# Patient Record
Sex: Female | Born: 1995 | Race: Black or African American | Hispanic: No | Marital: Single | Smoking: Never smoker
Health system: Southern US, Community
[De-identification: ages and names within clinical notes are randomized; demographics above are authoritative.]

## PROBLEM LIST (undated history)

## (undated) DIAGNOSIS — F410 Panic disorder [episodic paroxysmal anxiety] without agoraphobia: Secondary | ICD-10-CM

## (undated) DIAGNOSIS — L0291 Cutaneous abscess, unspecified: Secondary | ICD-10-CM

## (undated) DIAGNOSIS — F329 Major depressive disorder, single episode, unspecified: Secondary | ICD-10-CM

## (undated) DIAGNOSIS — F32A Depression, unspecified: Secondary | ICD-10-CM

## (undated) DIAGNOSIS — N809 Endometriosis, unspecified: Secondary | ICD-10-CM

## (undated) DIAGNOSIS — J45909 Unspecified asthma, uncomplicated: Secondary | ICD-10-CM

## (undated) DIAGNOSIS — K219 Gastro-esophageal reflux disease without esophagitis: Secondary | ICD-10-CM

## (undated) HISTORY — DX: Gastro-esophageal reflux disease without esophagitis: K21.9

## (undated) HISTORY — PX: MYRINGOTOMY WITH TUBE PLACEMENT: SHX5663

## (undated) HISTORY — PX: OTHER SURGICAL HISTORY: SHX169

## (undated) HISTORY — DX: Endometriosis, unspecified: N80.9

---
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
## 2016-04-25 ENCOUNTER — Encounter: Payer: Self-pay | Admitting: Pediatrics

## 2016-04-26 ENCOUNTER — Encounter: Payer: Self-pay | Admitting: Pediatrics

## 2016-06-03 ENCOUNTER — Encounter (HOSPITAL_COMMUNITY): Payer: Self-pay | Admitting: *Deleted

## 2016-06-03 DIAGNOSIS — R000 Tachycardia, unspecified: Secondary | ICD-10-CM | POA: Diagnosis not present

## 2016-06-03 DIAGNOSIS — R42 Dizziness and giddiness: Secondary | ICD-10-CM | POA: Diagnosis not present

## 2016-06-03 LAB — CBC
HCT: 42.1 % (ref 36.0–46.0)
Hemoglobin: 14.3 g/dL (ref 12.0–15.0)
MCH: 28.7 pg (ref 26.0–34.0)
MCHC: 34 g/dL (ref 30.0–36.0)
MCV: 84.5 fL (ref 78.0–100.0)
PLATELETS: 251 10*3/uL (ref 150–400)
RBC: 4.98 MIL/uL (ref 3.87–5.11)
RDW: 12.8 % (ref 11.5–15.5)
WBC: 10.4 10*3/uL (ref 4.0–10.5)

## 2016-06-03 NOTE — ED Triage Notes (Signed)
The pt is c/o a headache to the back of her head  And dizziness for one week

## 2016-06-04 ENCOUNTER — Emergency Department (HOSPITAL_COMMUNITY)
Admission: EM | Admit: 2016-06-04 | Discharge: 2016-06-04 | Disposition: A | Discharge status: Home / Self Care | Payer: Medicaid Other | Attending: Emergency Medicine | Admitting: Emergency Medicine

## 2016-06-04 DIAGNOSIS — R42 Dizziness and giddiness: Secondary | ICD-10-CM

## 2016-06-04 DIAGNOSIS — R000 Tachycardia, unspecified: Secondary | ICD-10-CM

## 2016-06-04 LAB — I-STAT BETA HCG BLOOD, ED (MC, WL, AP ONLY): I-stat hCG, quantitative: <5 m[IU]/mL (ref <5)

## 2016-06-04 LAB — TSH: TSH: 1.079 u[IU]/mL (ref 0.350–4.500)

## 2016-06-04 MED ORDER — ONDANSETRON 4 MG PO TBDP: 4.0000 mg | ORAL_TABLET | Freq: Three times a day (TID) | ORAL | 0 refills | Status: DC | PRN

## 2016-06-04 MED ORDER — SODIUM CHLORIDE 0.9 % IV BOLUS (SEPSIS)
1000.0000 mL | Freq: Once | INTRAVENOUS | Status: AC
  Administered 2016-06-04: 1000 mL via INTRAVENOUS

## 2016-06-04 NOTE — ED Provider Notes (Signed)
[facility] DEPT Provider Note   CSN: [id number] Arrival date & time: 06/03/16  2233     History   Chief Complaint Chief Complaint  Patient presents with  . Dizziness    HPI Angela Ramos is a 20 y.o. female.  Symptoms of lightheadedness/dizziness for the past one week, that became constant instead of intermittent today prompting emergency department evaluation. No fever, nausea, vomiting, diarrhea, anorexia, SOB or cough. She reports a feeling of heart racing when symptomatic. She experiences worse symptoms when standing or walking but also has mild persistent symptoms while lying still. She states she has had similar symptoms in the past but less intense and shorter duration.   The history is provided by the patient. No language interpreter was used.  Dizziness  Quality:  Head spinning and lightheadedness Severity:  Moderate Duration:  1 week Associated symptoms: palpitations   Associated symptoms: no nausea     Past Medical History:  Diagnosis Date  . Panic attacks     Patient Active Problem List   Diagnosis Date Noted  . Routine screening for STI (sexually transmitted infection) 06/01/2014  . Dysmenorrhea 06/01/2014  . Encounter for management and injection of injectable progestin contraceptive 06/01/2014    History reviewed. No pertinent surgical history.  OB History    No data available       Home Medications    Prior to Admission medications   Medication Sig Start Date End Date Taking? Authorizing Provider  albuterol (PROVENTIL HFA;VENTOLIN HFA) 108 (90 BASE) MCG/ACT inhaler Inhale 2 puffs into the lungs every 6 (six) hours as needed for wheezing or shortness of breath. 02/28/15 03/31/15  Glynis Smiles, DO    Family History No family history on file.  Social History Social History  Substance Use Topics  . Smoking status: Never Smoker  . Smokeless tobacco: Never Used  . Alcohol use No     Allergies   Review of patient's allergies  indicates no known allergies.   Review of Systems Review of Systems  Constitutional: Negative for chills and fever.  HENT: Negative.   Eyes: Negative for visual disturbance.  Respiratory: Negative.   Cardiovascular: Positive for palpitations.  Gastrointestinal: Negative.  Negative for nausea.  Genitourinary: Negative for dysuria.  Musculoskeletal: Negative.   Skin: Negative.   Neurological: Positive for dizziness. Negative for syncope.     Physical Exam Updated Vital Signs BP 145/88 (BP Location: Right Arm)   Pulse (!) 145   Temp 98.9 F (37.2 C) (Oral)   Resp 18   Ht 5' 1.5" (1.562 m)   Wt 47.9 kg   SpO2 100%   BMI 19.61 kg/m   Physical Exam  Constitutional: She is oriented to person, place, and time. She appears well-developed and well-nourished.  HENT:  Head: Normocephalic.  Eyes: Pupils are equal, round, and reactive to light.  Neck: Normal range of motion. Neck supple. No thyromegaly present.  Cardiovascular: Regular rhythm.  Tachycardia present.   No murmur heard. Pulmonary/Chest: Effort normal and breath sounds normal.  Abdominal: Soft. Bowel sounds are normal. There is no tenderness. There is no rebound and no guarding.  Musculoskeletal: Normal range of motion.  Neurological: She is alert and oriented to person, place, and time. She has normal strength and normal reflexes. No sensory deficit. She displays a negative Romberg sign. Coordination normal.  Skin: Skin is warm and dry. No rash noted.  Psychiatric: She has a normal mood and affect.     ED Treatments / Results  Labs (  all labs ordered are listed, but only abnormal results are displayed) Labs Reviewed  CBC  TSH  I-STAT BETA HCG BLOOD, ED ([facility], [facility], [facility] ONLY)    EKG  EKG Interpretation None       Radiology No results found.  Procedures Procedures (including critical care time)  Medications Ordered in ED Medications  sodium chloride 0.9 % bolus 1,000 mL (not administered)      Initial Impression / Assessment and Plan / ED Course  I have reviewed the triage vital signs and the nursing notes.  Pertinent labs & imaging results that were available during my care of the patient were reviewed by me and considered in my medical decision making (see chart for details).  Clinical Course    Patient presents with complaint of dizziness without syncope for the past 1 week, worse today. No fever, pain, vomiting, diarrhea.   She is tachycardic on arrival. Normal HGB, thyroid, negative pregnancy. IVF's provided with improvement. EKG tachycardic, NSR. She is ambulatory with less dizziness, "80% better". She can be discharged to home with PCP follow up.  Final Clinical Impressions(s) / ED Diagnoses   Final diagnoses:  None   1. Dizziness 2. Tachycardia  New Prescriptions New Prescriptions   No medications on file     Charlann Lange, PA-C 06/04/16 [place], DO 06/04/16 [id number]

## 2016-07-02 IMAGING — CR DG THORACOLUMBAR SPINE STANDING SCOLIOSIS
2 series · 6 of 6 positions shown · non-contrast
Comparison: None.

CLINICAL DATA: Scoliosis.

EXAM:
THORACOLUMBAR SCOLIOSIS STUDY - STANDING VIEWS

[Series 1001: view not recorded · 0.40mm/px · 3 of 3 slices shown (1 of 2)]
[im 1/3]
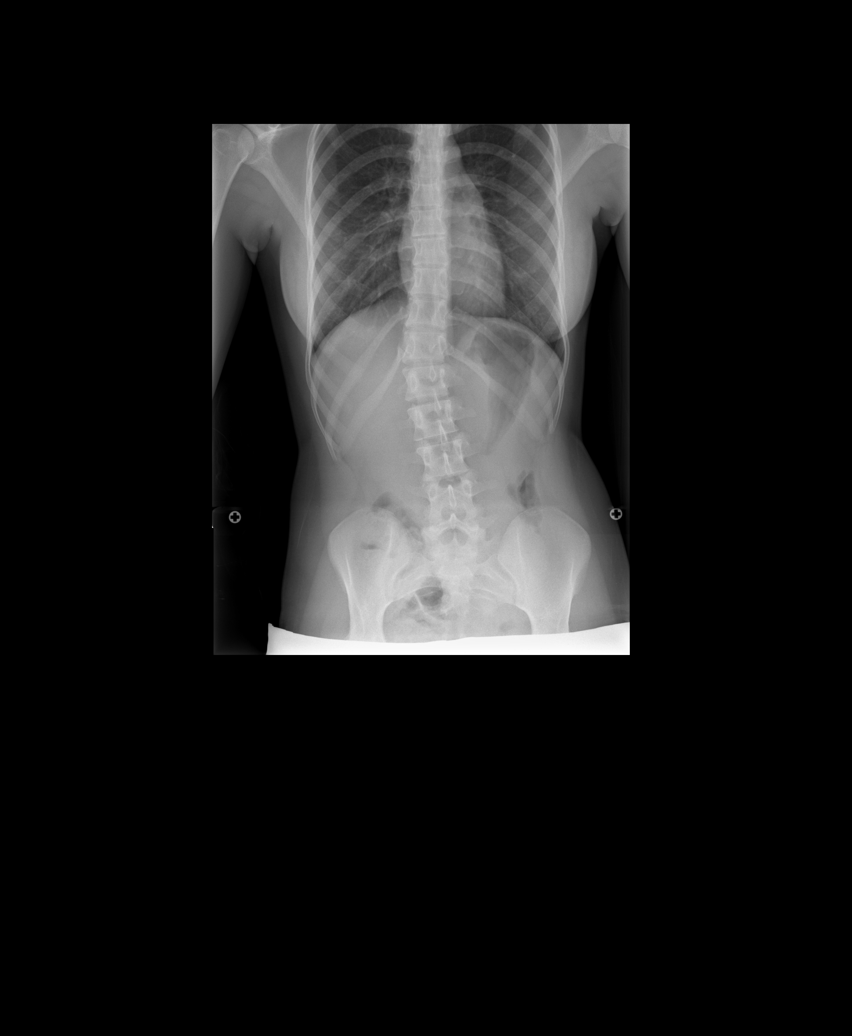
[im 2/3]
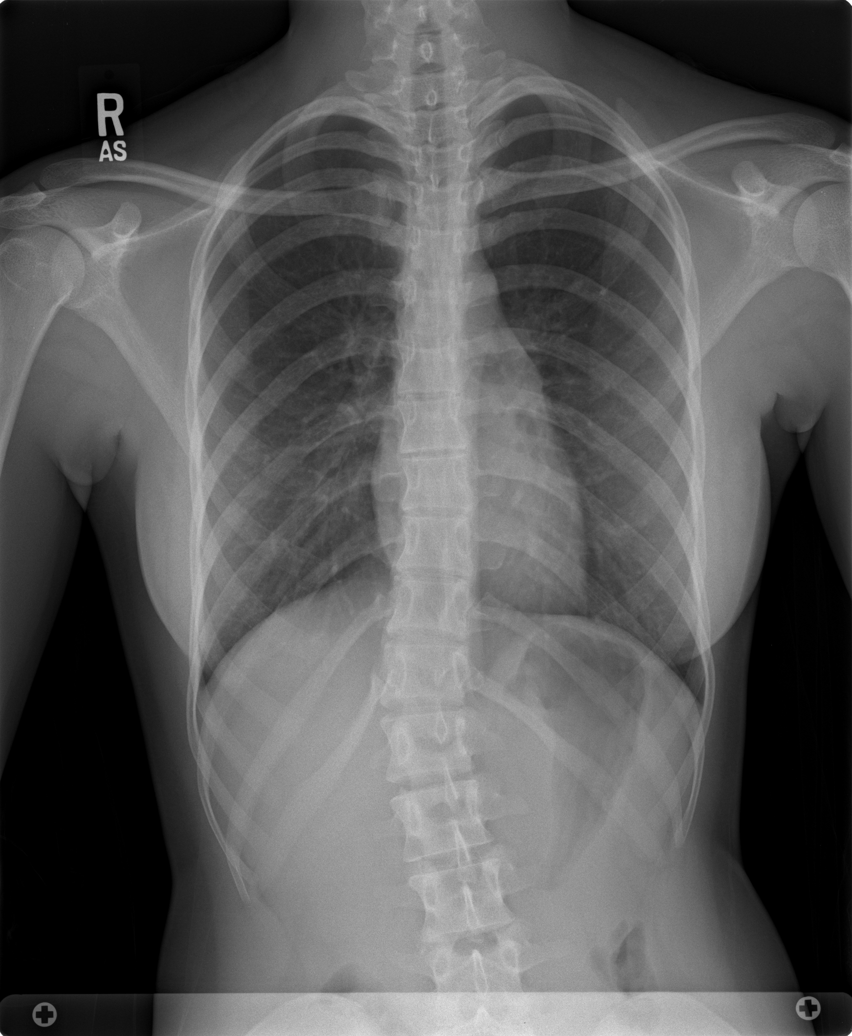
[im 3/3]
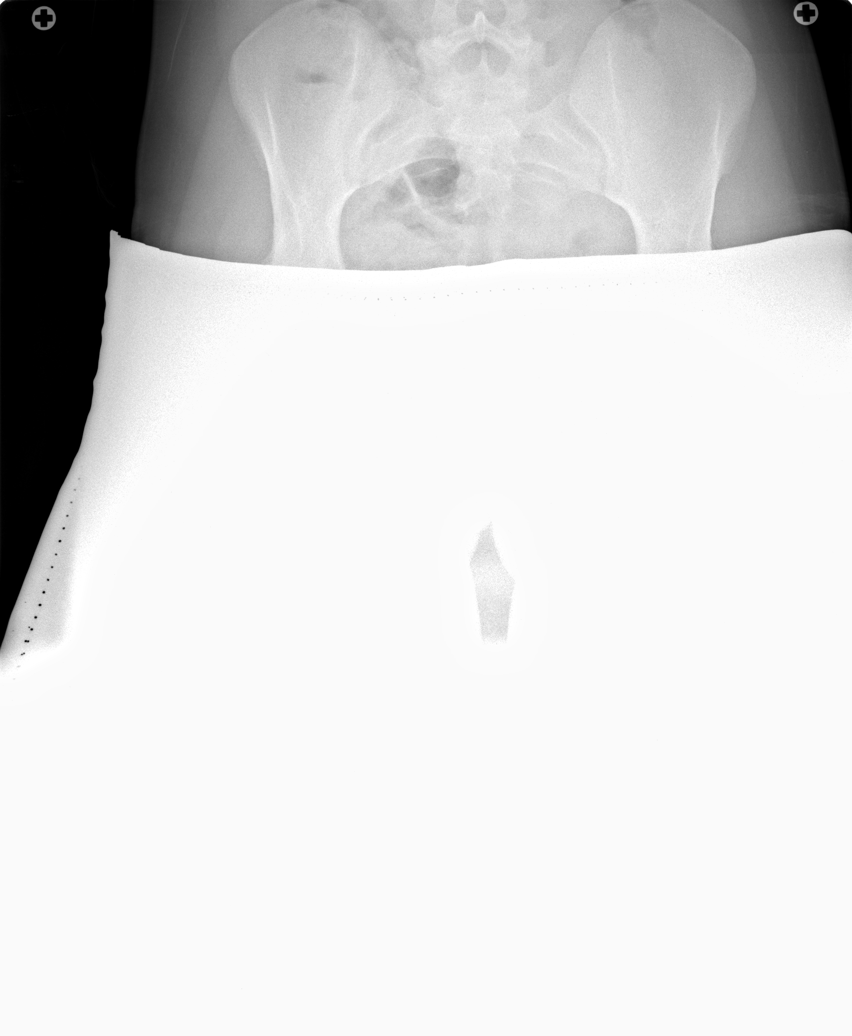

[Series 1009: view not recorded · 0.40mm/px · 3 of 3 slices shown (2 of 2)]
[im 1/3]
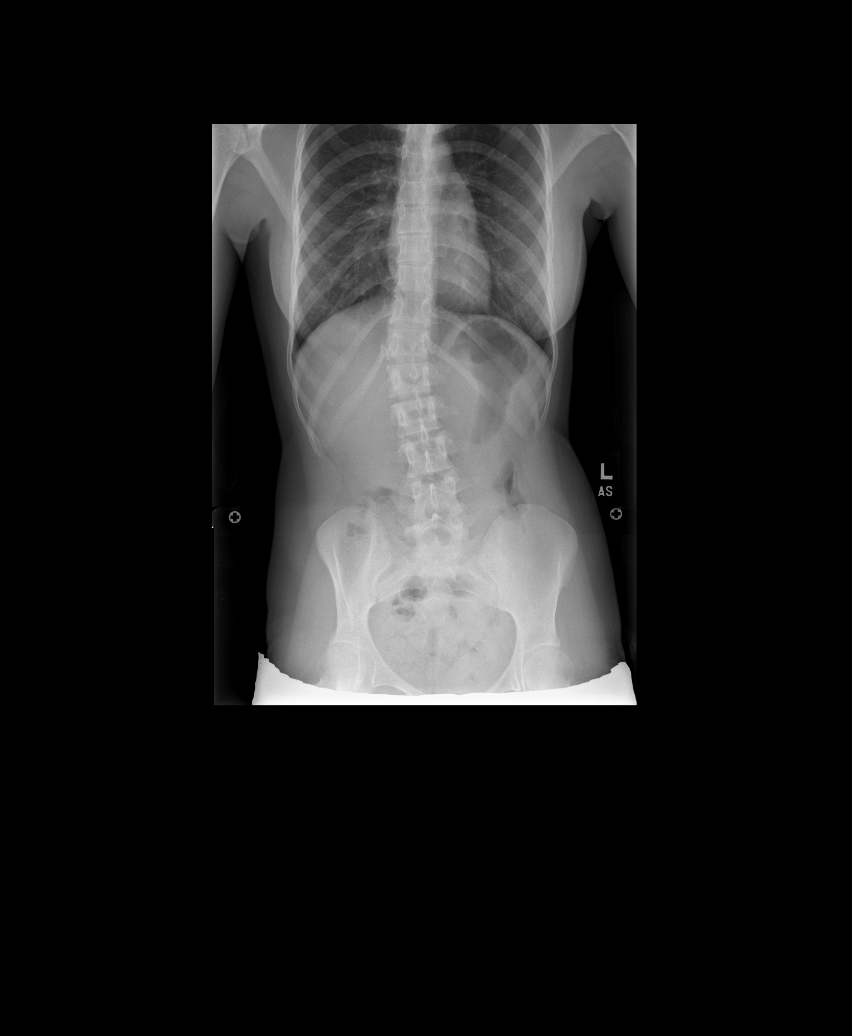
[im 2/3]
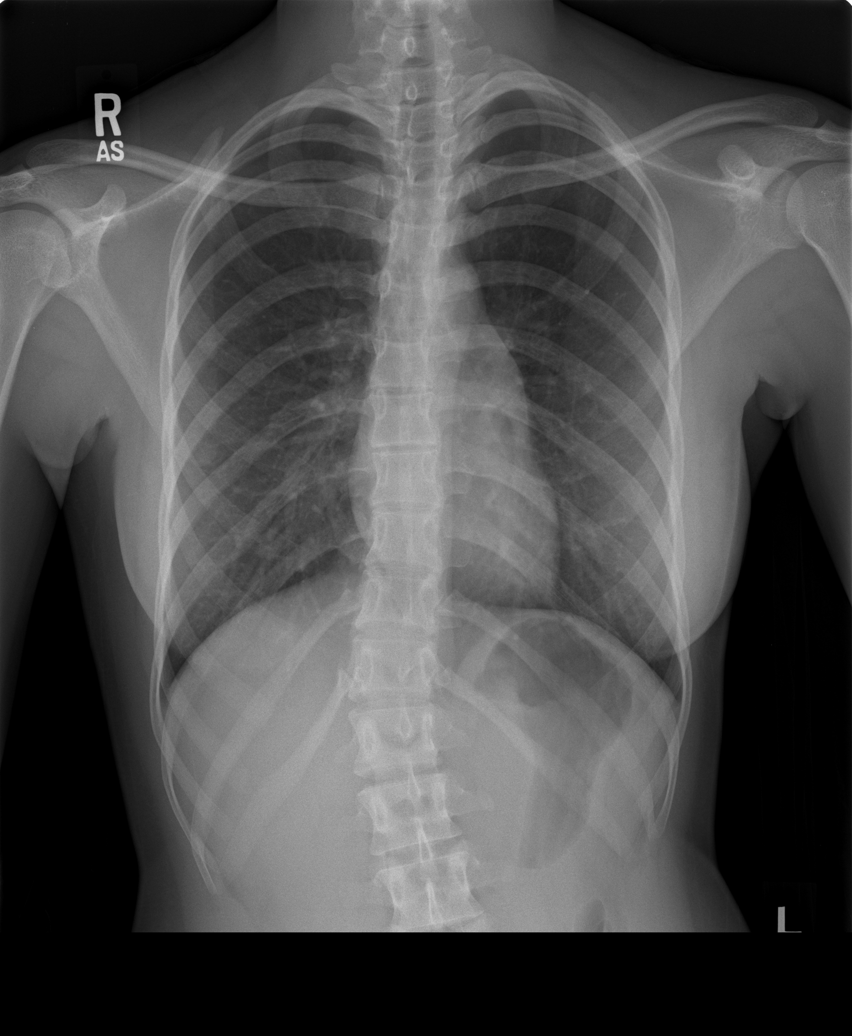
[im 3/3]
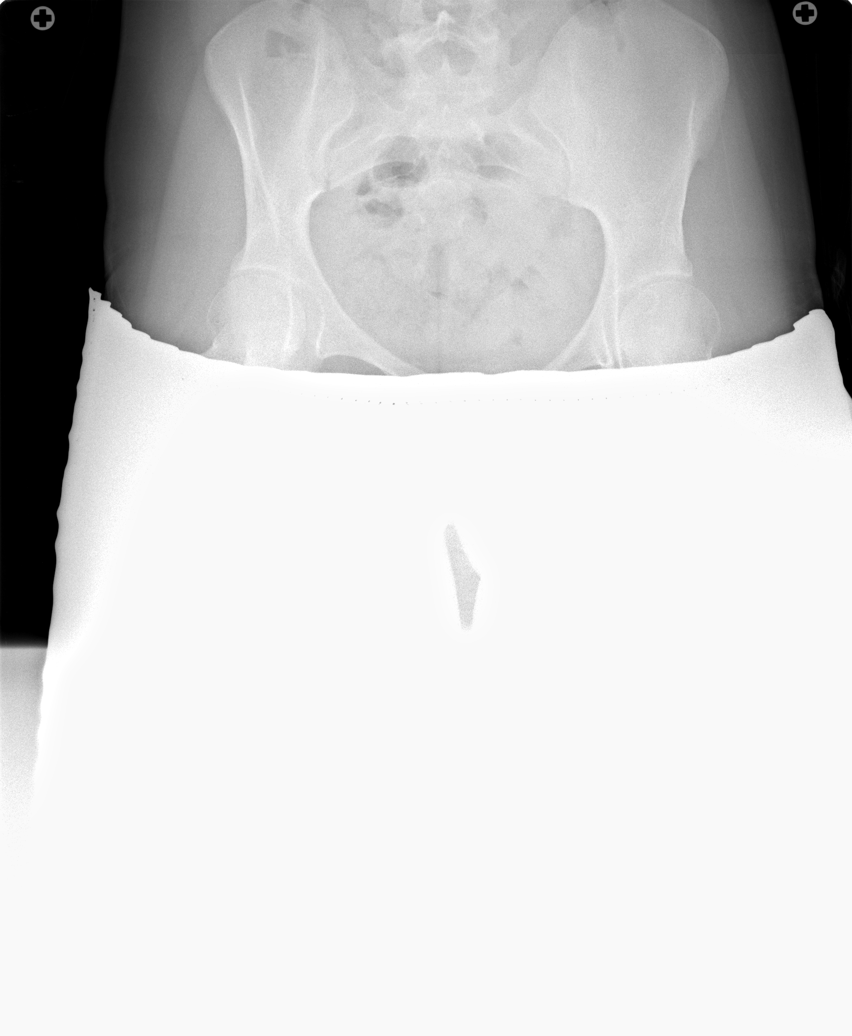

[6 of 6 positions shown; findings below may reference images not displayed]

FINDINGS: Mild S shaped scoliosis of the thoracic spine is noted with 14
degree scoliosis of the mid thoracic spine concave left and 8
degrees scoliosis of the lower thoracic spine concave right. 9
degree scoliosis of the lumbar spine concave left present. No focal
acute bony abnormality identified.
IMPRESSION: Thoracolumbar spine scoliosis as described above.

## 2017-04-13 ENCOUNTER — Encounter (HOSPITAL_COMMUNITY): Payer: Self-pay | Admitting: Emergency Medicine

## 2017-04-13 ENCOUNTER — Ambulatory Visit (HOSPITAL_COMMUNITY)
Admission: EM | Admit: 2017-04-13 | Discharge: 2017-04-13 | Disposition: A | Discharge status: Home / Self Care | Payer: Medicaid Other | Attending: Internal Medicine | Admitting: Internal Medicine

## 2017-04-13 DIAGNOSIS — J029 Acute pharyngitis, unspecified: Secondary | ICD-10-CM | POA: Diagnosis not present

## 2017-04-13 LAB — POCT RAPID STREP A: Streptococcus, Group A Screen (Direct): NEGATIVE

## 2017-04-13 NOTE — ED Triage Notes (Signed)
Pt here for ST onset yest associated w/odynophagia  Denies fevers, chill  Taking OTC Chloraseptic spray w/no relief.   Taking prednisone from last week due to bronchitis   A&O x4... NAD... Ambulatory

## 2017-04-13 NOTE — Discharge Instructions (Signed)
Your strep test today is negative. We will call you if your throat culture is positive. Meanwhile, please treat symptomatically with salt water gargle, honey, throat lozenges or even ibuprofen if needed.  Please follow with the primary care doctor for improvement.

## 2017-04-13 NOTE — ED Provider Notes (Signed)
CSN: [id number]     Arrival date & time 04/13/17  1200 History   First MD Initiated Contact with Patient 04/13/17 1224     Chief Complaint  Patient presents with  . Sore Throat   (Consider location/radiation/quality/duration/timing/severity/associated sxs/prior Treatment) The history is provided by the patient.  Sore Throat  This is a new problem. The current episode started yesterday. The problem occurs constantly. The problem has been gradually worsening. Pertinent negatives include no chest pain, no abdominal pain, no headaches and no shortness of breath. Associated symptoms comments: Positive for coughing and nasal congestion. . The symptoms are aggravated by swallowing. Relieved by: NO relief with OTC chloraseptic spray.  The treatment provided no relief.    Past Medical History:  Diagnosis Date  . Panic attacks    History reviewed. No pertinent surgical history. History reviewed. No pertinent family history. Social History  Substance Use Topics  . Smoking status: Never Smoker  . Smokeless tobacco: Never Used  . Alcohol use No   OB History    No data available     Review of Systems  Constitutional:       See HPI  Respiratory: Negative for shortness of breath.   Cardiovascular: Negative for chest pain.  Gastrointestinal: Negative for abdominal pain.  Neurological: Negative for headaches.    Allergies  Patient has no known allergies.  Home Medications   Prior to Admission medications   Medication Sig Start Date End Date Taking? Authorizing Provider  hydrOXYzine (ATARAX/VISTARIL) 25 MG tablet Take 25 mg by mouth 3 (three) times daily as needed.   Yes [provider]  montelukast (SINGULAIR) 10 MG tablet Take 10 mg by mouth at bedtime.   Yes [provider]  predniSONE (DELTASONE) 10 MG tablet Take 10 mg by mouth daily with breakfast.   Yes [provider]  albuterol (PROVENTIL HFA;VENTOLIN HFA) 108 (90 BASE) MCG/ACT inhaler Inhale 2 puffs  into the lungs every 6 (six) hours as needed for wheezing or shortness of breath. 02/28/15 03/31/15  Bush, Tamika, DO  ondansetron (ZOFRAN ODT) 4 MG disintegrating tablet Take 1 tablet (4 mg total) by mouth every 8 (eight) hours as needed for nausea or vomiting. 06/04/16   Charlann Lange, PA-C   Meds Ordered and Administered this Visit  Medications - No data to display  BP 117/77 (BP Location: Left Arm)   Pulse (!) 112   Temp 98.9 F (37.2 C) (Oral)   Resp 20   SpO2 100%  No data found.   Physical Exam  Constitutional: She is oriented to person, place, and time. She appears well-developed and well-nourished.  HENT:  Head: Normocephalic and atraumatic.  Right Ear: External ear normal.  Left Ear: External ear normal.  Nose: Nose normal.  Mouth/Throat: Oropharynx is clear and moist. No oropharyngeal exudate.  Eyes: Pupils are equal, round, and reactive to light. Conjunctivae are normal.  Neck: Normal range of motion. Neck supple.  Cardiovascular: Normal rate, regular rhythm and normal heart sounds.   No murmur heard. Pulmonary/Chest: Effort normal and breath sounds normal. She has no wheezes.  Abdominal: Soft. Bowel sounds are normal. There is no tenderness.  Lymphadenopathy:    She has no cervical adenopathy.  Neurological: She is alert and oriented to person, place, and time.  Skin: Skin is warm and dry. No rash noted.  Psychiatric: She has a normal mood and affect.  Nursing note and vitals reviewed.   Urgent Care Course     Procedures (including critical care  time)  Labs Review Labs Reviewed  POCT RAPID STREP A    Imaging Review No results found.  MDM   1. Viral pharyngitis    Exam unremarkable. Rapid strep negative. Culture pending. Informed we will call them if culture comes back positive. Supportive treatment discussed. Informed to f/u with PCP for no improvement.     Barry Dienes, NP 04/13/17 1243

## 2017-04-16 LAB — CULTURE, GROUP A STREP (THRC)

## 2017-06-01 ENCOUNTER — Encounter (HOSPITAL_COMMUNITY): Payer: Self-pay | Admitting: Emergency Medicine

## 2017-06-01 ENCOUNTER — Emergency Department (HOSPITAL_COMMUNITY)
Admission: EM | Admit: 2017-06-01 | Discharge: 2017-06-02 | Disposition: A | Discharge status: Home / Self Care | Payer: Medicaid Other | Attending: Emergency Medicine | Admitting: Emergency Medicine

## 2017-06-01 DIAGNOSIS — R0602 Shortness of breath: Secondary | ICD-10-CM | POA: Diagnosis not present

## 2017-06-01 DIAGNOSIS — R05 Cough: Secondary | ICD-10-CM | POA: Insufficient documentation

## 2017-06-01 DIAGNOSIS — Z79899 Other long term (current) drug therapy: Secondary | ICD-10-CM | POA: Insufficient documentation

## 2017-06-01 LAB — POC URINE PREG, ED: PREG TEST UR: NEGATIVE

## 2017-06-01 NOTE — ED Triage Notes (Signed)
Pt c/o shob x 2 days, pt reports she has had increased congestion x 2 months. Has been seen by PCP and was told left lung was "weak". Pt used MDI at home without relief.

## 2017-06-02 NOTE — ED Provider Notes (Signed)
[facility] DEPT Provider Note   CSN: [id number] Arrival date & time: 06/01/17  2257     History   Chief Complaint Chief Complaint  Patient presents with  . Shortness of Breath    HPI Angela Ramos is a 21 y.o. female.  The history is provided by the patient.  Shortness of Breath  This is a new problem. Duration: 2 months. The problem occurs frequently.Progression since onset: fluctuating. Associated symptoms include cough (dry) and chest pain (sporadic, brief, sharp, shooting pains to right chest). Pertinent negatives include no fever, no headaches, no coryza, no rhinorrhea, no sputum production, no hemoptysis, no wheezing, no vomiting, no abdominal pain, no leg pain and no leg swelling. She has tried beta-agonist inhalers, leukotriene antagonists and oral steroids for the symptoms. The treatment provided moderate relief. Associated medical issues include asthma.   Seen by PCP and Rx'd several courses of prednisone which initially help. Symptoms return after this is completed.   Past Medical History:  Diagnosis Date  . Panic attacks     Patient Active Problem List   Diagnosis Date Noted  . Routine screening for STI (sexually transmitted infection) 06/01/2014  . Dysmenorrhea 06/01/2014  . Encounter for management and injection of injectable progestin contraceptive 06/01/2014    History reviewed. No pertinent surgical history.  OB History    No data available       Home Medications    Prior to Admission medications   Medication Sig Start Date End Date Taking? Authorizing Provider  albuterol (PROVENTIL HFA;VENTOLIN HFA) 108 (90 BASE) MCG/ACT inhaler Inhale 2 puffs into the lungs every 6 (six) hours as needed for wheezing or shortness of breath. 02/28/15 03/31/15  Glynis Smiles, DO  hydrOXYzine (ATARAX/VISTARIL) 25 MG tablet Take 25 mg by mouth 3 (three) times daily as needed.    [provider]  montelukast (SINGULAIR) 10 MG tablet Take 10 mg by mouth  at bedtime.    [provider]  ondansetron (ZOFRAN ODT) 4 MG disintegrating tablet Take 1 tablet (4 mg total) by mouth every 8 (eight) hours as needed for nausea or vomiting. 06/04/16   Charlann Lange, PA-C  predniSONE (DELTASONE) 10 MG tablet Take 10 mg by mouth daily with breakfast.    [provider]    Family History No family history on file.  Social History Social History  Substance Use Topics  . Smoking status: Never Smoker  . Smokeless tobacco: Never Used  . Alcohol use No     Allergies   Patient has no known allergies.   Review of Systems Review of Systems  Constitutional: Negative for fever.  HENT: Negative for rhinorrhea.   Respiratory: Positive for cough (dry) and shortness of breath. Negative for hemoptysis, sputum production and wheezing.   Cardiovascular: Positive for chest pain (sporadic, brief, sharp, shooting pains to right chest). Negative for leg swelling.  Gastrointestinal: Negative for abdominal pain and vomiting.  Neurological: Negative for headaches.  All other systems are reviewed and are negative for acute change except as noted in the HPI    Physical Exam Updated Vital Signs BP 111/81   Pulse 93   Temp 98.9 F (37.2 C) (Oral)   Resp 16   Ht 5\' 1"  (1.549 m)   Wt 48.5 kg (107 lb)   LMP 04/24/2017   SpO2 100%   BMI 20.22 kg/m   Physical Exam  Constitutional: She is oriented to person, place, and time. She appears well-developed and well-nourished. No distress.  HENT:  Head:  Normocephalic and atraumatic.  Nose: Nose normal. No mucosal edema.  Eyes: Pupils are equal, round, and reactive to light. Conjunctivae and EOM are normal. Right eye exhibits no discharge. Left eye exhibits no discharge. No scleral icterus.  Neck: Normal range of motion. Neck supple.  Cardiovascular: Normal rate and regular rhythm.  Exam reveals no gallop and no friction rub.   No murmur heard. Pulmonary/Chest: Effort normal and breath sounds normal.  No accessory muscle usage or stridor. No respiratory distress. She has no decreased breath sounds. She has no wheezes. She has no rhonchi. She has no rales.  Abdominal: Soft. She exhibits no distension. There is no tenderness.  Musculoskeletal: She exhibits no edema or tenderness.  Neurological: She is alert and oriented to person, place, and time.  Skin: Skin is warm and dry. No rash noted. She is not diaphoretic. No erythema.  Psychiatric: She has a normal mood and affect.  Vitals reviewed.    ED Treatments / Results  Labs (all labs ordered are listed, but only abnormal results are displayed) Labs Reviewed  POC URINE PREG, ED    EKG  EKG Interpretation  Date/Time:  Saturday June 01 2017 23:04:11 EDT Ventricular Rate:  87 PR Interval:  126 QRS Duration: 70 QT Interval:  346 QTC Calculation: 416 R Axis:   78 Text Interpretation:  Normal sinus rhythm with sinus arrhythmia Normal ECG No significant change since last tracing Confirmed by Addison Lank [phone]) on 06/01/2017 11:24:20 PM       Radiology No results found.  Procedures Procedures (including critical care time)  Medications Ordered in ED Medications - No data to display   Initial Impression / Assessment and Plan / ED Course  I have reviewed the triage vital signs and the nursing notes.  Pertinent labs & imaging results that were available during my care of the patient were reviewed by me and considered in my medical decision making (see chart for details).     The patient appears well, in no acute distress, without evidence of toxicity or dehydration. Lungs CTAB. EKG w/o acute ischemia or evidence to suggest pericarditis. No indication for CXR at this time. Low pretest probability for PE. PERC negative. Recommended continued symptomatic treatment with MDI and close PCP follow up.   The patient appears reasonably screened and/or stabilized for discharge and I doubt any other medical condition or other [facility]  requiring further screening, evaluation, or treatment in the ED at this time prior to discharge.   Final Clinical Impressions(s) / ED Diagnoses   Final diagnoses:  Shortness of breath   Disposition: Discharge  Condition: Good  I have discussed the results, Dx and Tx plan with the patient who expressed understanding and agree(s) with the plan. Discharge instructions discussed at great length. The patient was given strict return precautions who verbalized understanding of the instructions. No further questions at time of discharge.    New Prescriptions   No medications on file    Follow Up: Primary care provider  Schedule an appointment as soon as possible for a visit  If symptoms do not improve or  worsen      Sebastain Fishbaugh, Grayce Sessions, MD 06/02/17 0013

## 2017-07-07 ENCOUNTER — Ambulatory Visit (HOSPITAL_COMMUNITY)
Admission: EM | Admit: 2017-07-07 | Discharge: 2017-07-07 | Disposition: A | Discharge status: Home / Self Care | Payer: Medicaid Other | Attending: Internal Medicine | Admitting: Internal Medicine

## 2017-07-07 ENCOUNTER — Encounter (HOSPITAL_COMMUNITY): Payer: Self-pay | Admitting: *Deleted

## 2017-07-07 DIAGNOSIS — J0111 Acute recurrent frontal sinusitis: Secondary | ICD-10-CM | POA: Diagnosis not present

## 2017-07-07 HISTORY — DX: Major depressive disorder, single episode, unspecified: F32.9

## 2017-07-07 HISTORY — DX: Unspecified asthma, uncomplicated: J45.909

## 2017-07-07 HISTORY — DX: Depression, unspecified: F32.A

## 2017-07-07 MED ORDER — AMOXICILLIN-POT CLAVULANATE 875-125 MG PO TABS: 1.0000 | ORAL_TABLET | Freq: Two times a day (BID) | ORAL | 0 refills | Status: DC

## 2017-07-07 MED ORDER — CETIRIZINE HCL 10 MG PO TABS: 10.0000 mg | ORAL_TABLET | Freq: Every day | ORAL | 0 refills | Status: DC

## 2017-07-07 MED ORDER — IPRATROPIUM BROMIDE 0.06 % NA SOLN: 2.0000 | Freq: Four times a day (QID) | NASAL | 0 refills | Status: DC

## 2017-07-07 NOTE — ED Provider Notes (Signed)
[facility]    CSN: [id number] Arrival date & time: 07/07/17  1828     History   Chief Complaint Chief Complaint  Patient presents with  . Nasal Congestion    HPI Angela Ramos is a 21 y.o. female.   21 year old female with history of asthma, depression, panic attacks comes in for recurrent nasal congestion. States has ben having on and off nasal congestion for the past few months. Current episode started a couple of weeks ago. Has also noticed some post nasal drip. Denies cough, sore throat, ear pain, eye pain. But notices pain on the frontal sinuses. States she has been on and off allergy medicine that may or may not have helped. Does not recall the allergy medications she has been on. She has had shortness of breath when she lays down due to inability to breath through the nose. Had sharp chest pain this morning turning off of the bed that last for a second and has since resolved. Denies fever, chills, night sweats. She contacted her ENT due to these recurrent symptoms but was not able to get an appointment till later this month.       Past Medical History:  Diagnosis Date  . Asthma   . Depression   . Panic attacks     Patient Active Problem List   Diagnosis Date Noted  . Routine screening for STI (sexually transmitted infection) 06/01/2014  . Dysmenorrhea 06/01/2014  . Encounter for management and injection of injectable progestin contraceptive 06/01/2014    Past Surgical History:  Procedure Laterality Date  . Leg mass removed    . MYRINGOTOMY WITH TUBE PLACEMENT      OB History    No data available       Home Medications    Prior to Admission medications   Medication Sig Start Date End Date Taking? Authorizing Provider  amitriptyline (ELAVIL) 10 MG tablet Take 10 mg by mouth at bedtime.   Yes [provider]  albuterol (PROVENTIL HFA;VENTOLIN HFA) 108 (90 BASE) MCG/ACT inhaler Inhale 2 puffs into the lungs every 6 (six) hours  as needed for wheezing or shortness of breath. 02/28/15 03/31/15  Glynis Smiles, DO  amoxicillin-clavulanate (AUGMENTIN) 875-125 MG tablet Take 1 tablet by mouth every 12 (twelve) hours. 07/07/17   Tasia Catchings, Jsaon Yoo V, PA-C  cetirizine (ZYRTEC) 10 MG tablet Take 1 tablet (10 mg total) by mouth daily. 07/07/17   Tasia Catchings, Khloee Garza V, PA-C  hydrOXYzine (ATARAX/VISTARIL) 25 MG tablet Take 25 mg by mouth 3 (three) times daily as needed.    [provider]  ipratropium (ATROVENT) 0.06 % nasal spray Place 2 sprays into both nostrils 4 (four) times daily. 07/07/17   Tasia Catchings, Salah Nakamura V, PA-C  montelukast (SINGULAIR) 10 MG tablet Take 10 mg by mouth at bedtime.    [provider]  ondansetron (ZOFRAN ODT) 4 MG disintegrating tablet Take 1 tablet (4 mg total) by mouth every 8 (eight) hours as needed for nausea or vomiting. 06/04/16   Charlann Lange, PA-C  predniSONE (DELTASONE) 10 MG tablet Take 10 mg by mouth daily with breakfast.    [provider]    Family History No family history on file.  Social History Social History  Substance Use Topics  . Smoking status: Never Smoker  . Smokeless tobacco: Never Used  . Alcohol use No     Allergies   Patient has no known allergies.   Review of Systems Review of Systems  Reason unable to  perform ROS: See HPI as above.     Physical Exam Triage Vital Signs ED Triage Vitals [07/07/17 1913]  Enc Vitals Group     BP 116/70     Pulse Rate (!) 111     Resp 16     Temp 98.4 F (36.9 C)     Temp Source Oral     SpO2 100 %     Weight      Height      Head Circumference      Peak Flow      Pain Score      Pain Loc      Pain Edu?      Excl. in [place]?    No data found.   Updated Vital Signs BP 116/70   Pulse (!) 111   Temp 98.4 F (36.9 C) (Oral)   Resp 16   LMP 07/04/2017 (Exact Date)   SpO2 100%   Physical Exam  Constitutional: She is oriented to person, place, and time. She appears well-developed and well-nourished. No distress.  HENT:    Head: Normocephalic and atraumatic.  Right Ear: External ear and ear canal normal.  Left Ear: External ear and ear canal normal.  Nose: Mucosal edema present. Right sinus exhibits frontal sinus tenderness. Right sinus exhibits no maxillary sinus tenderness. Left sinus exhibits frontal sinus tenderness. Left sinus exhibits no maxillary sinus tenderness.  Mouth/Throat: Uvula is midline and mucous membranes are normal. Posterior oropharyngeal erythema present.  Bilateral PE tube placement, TM without erythema, no drainage seen.  Eyes: Pupils are equal, round, and reactive to light. Conjunctivae are normal.  Neck: Normal range of motion. Neck supple.  Cardiovascular: Normal rate, regular rhythm and normal heart sounds.  Exam reveals no gallop and no friction rub.   No murmur heard. Pulmonary/Chest: Effort normal and breath sounds normal. She has no decreased breath sounds. She has no wheezes. She has no rhonchi. She has no rales.  Lymphadenopathy:    She has no cervical adenopathy.  Neurological: She is alert and oriented to person, place, and time.  Skin: Skin is warm and dry.  Psychiatric: She has a normal mood and affect. Her behavior is normal. Judgment normal.     UC Treatments / Results  Labs (all labs ordered are listed, but only abnormal results are displayed) Labs Reviewed - No data to display  EKG  EKG Interpretation None       Radiology No results found.  Procedures Procedures (including critical care time)  Medications Ordered in UC Medications - No data to display   Initial Impression / Assessment and Plan / UC Course  I have reviewed the triage vital signs and the nursing notes.  Pertinent labs & imaging results that were available during my care of the patient were reviewed by me and considered in my medical decision making (see chart for details).    Start Augmentin for sinusitis. Continue flonase. Zyrtec and atrovent nasal spray for nasal congestion.  Follow up with ENT as scheduled for further evaluation. Return precautions given.   Final Clinical Impressions(s) / UC Diagnoses   Final diagnoses:  Acute recurrent frontal sinusitis    New Prescriptions New Prescriptions   AMOXICILLIN-CLAVULANATE (AUGMENTIN) 875-125 MG TABLET    Take 1 tablet by mouth every 12 (twelve) hours.   CETIRIZINE (ZYRTEC) 10 MG TABLET    Take 1 tablet (10 mg total) by mouth daily.   IPRATROPIUM (ATROVENT) 0.06 % NASAL SPRAY    Place 2 sprays  into both nostrils 4 (four) times daily.     Ok Edwards, PA-C 07/07/17 1950

## 2017-07-07 NOTE — Discharge Instructions (Signed)
Start Augmentin as directed. Continue flonase. Can start atrovent and zyrtec for nasal congestion. You can use over the counter nasal saline rinse such as neti pot for nasal congestion. Keep hydrated, your urine should be clear to pale yellow in color. Tylenol/motrin for fever and pain. Monitor for any worsening of symptoms, chest pain, shortness of breath, wheezing, swelling of the throat, follow up for reevaluation. Follow up with ENT as scheduled for further evaluation.

## 2017-07-07 NOTE — ED Notes (Signed)
Patient discharged by provider Jodi Mourning, MD

## 2017-07-07 NOTE — ED Triage Notes (Signed)
C/O nasal congestion; yesterday was having some chest pains and SOB; states has felt like this the last time she started her menstrual cycle & was seen in ED; LMP started 3 days ago.

## 2017-08-30 HISTORY — PX: TURBINATE REDUCTION: SHX6157

## 2017-09-07 ENCOUNTER — Encounter (HOSPITAL_COMMUNITY): Payer: Self-pay | Admitting: Emergency Medicine

## 2017-09-07 ENCOUNTER — Ambulatory Visit (HOSPITAL_COMMUNITY)
Admission: EM | Admit: 2017-09-07 | Discharge: 2017-09-07 | Disposition: A | Discharge status: Home / Self Care | Payer: Medicaid Other | Attending: Physician Assistant | Admitting: Physician Assistant

## 2017-09-07 ENCOUNTER — Other Ambulatory Visit: Payer: Self-pay

## 2017-09-07 DIAGNOSIS — R0602 Shortness of breath: Secondary | ICD-10-CM | POA: Diagnosis not present

## 2017-09-07 DIAGNOSIS — R05 Cough: Secondary | ICD-10-CM

## 2017-09-07 DIAGNOSIS — R059 Cough, unspecified: Secondary | ICD-10-CM

## 2017-09-07 MED ORDER — HYDROCODONE-HOMATROPINE 5-1.5 MG/5ML PO SYRP: 5.0000 mL | ORAL_SOLUTION | Freq: Four times a day (QID) | ORAL | 0 refills | Status: DC | PRN

## 2017-09-07 NOTE — ED Provider Notes (Signed)
[place]    CSN: [id number] Arrival date & time: 09/07/17  1740     History   Chief Complaint Chief Complaint  Patient presents with  . Chest Pain    HPI Angela Ramos is a 21 y.o. female.   Presents with cough, drainage and feelings of breathlessness since her sinus surgery 1 week ago. She denies fever or chills. She is having a hard time with the feeling that she cannot breath through her nose. She has a history of mild asthma and has an inhaler but has not used it. Some mild non-productive cough is noted. Her chest feels "tight" at times, especially when she is coughing. She is using Tylenol currently.       Past Medical History:  Diagnosis Date  . Asthma   . Depression   . Panic attacks     Patient Active Problem List   Diagnosis Date Noted  . Routine screening for STI (sexually transmitted infection) 06/01/2014  . Dysmenorrhea 06/01/2014  . Encounter for management and injection of injectable progestin contraceptive 06/01/2014    Past Surgical History:  Procedure Laterality Date  . Leg mass removed    . MYRINGOTOMY WITH TUBE PLACEMENT    . TURBINATE REDUCTION Bilateral 08/30/2017    OB History    No data available       Home Medications    Prior to Admission medications   Medication Sig Start Date End Date Taking? Authorizing Provider  amitriptyline (ELAVIL) 10 MG tablet Take 10 mg by mouth at bedtime.   Yes [provider]  albuterol (PROVENTIL HFA;VENTOLIN HFA) 108 (90 BASE) MCG/ACT inhaler Inhale 2 puffs into the lungs every 6 (six) hours as needed for wheezing or shortness of breath. 02/28/15 03/31/15  Glynis Smiles, DO  amoxicillin-clavulanate (AUGMENTIN) 875-125 MG tablet Take 1 tablet by mouth every 12 (twelve) hours. 07/07/17   Tasia Catchings, Amy V, PA-C  cetirizine (ZYRTEC) 10 MG tablet Take 1 tablet (10 mg total) by mouth daily. 07/07/17   Tasia Catchings, Amy V, PA-C  HYDROcodone-homatropine (HYCODAN) 5-1.5 MG/5ML syrup Take 5 mLs by  mouth every 6 (six) hours as needed for cough. 09/07/17   Bjorn Pippin, PA-C  hydrOXYzine (ATARAX/VISTARIL) 25 MG tablet Take 25 mg by mouth 3 (three) times daily as needed.    [provider]  ipratropium (ATROVENT) 0.06 % nasal spray Place 2 sprays into both nostrils 4 (four) times daily. 07/07/17   Tasia Catchings, Amy V, PA-C  montelukast (SINGULAIR) 10 MG tablet Take 10 mg by mouth at bedtime.    [provider]  ondansetron (ZOFRAN ODT) 4 MG disintegrating tablet Take 1 tablet (4 mg total) by mouth every 8 (eight) hours as needed for nausea or vomiting. 06/04/16   Charlann Lange, PA-C  predniSONE (DELTASONE) 10 MG tablet Take 10 mg by mouth daily with breakfast.    [provider]    Family History History reviewed. No pertinent family history.  Social History Social History   Tobacco Use  . Smoking status: Never Smoker  . Smokeless tobacco: Never Used  Substance Use Topics  . Alcohol use: No  . Drug use: No     Allergies   Patient has no known allergies.   Review of Systems Review of Systems  Constitutional: Negative for chills and fever.  HENT: Positive for rhinorrhea, sinus pressure and sinus pain. Negative for congestion.   Eyes: Negative.   Respiratory: Positive for cough and shortness of breath.   Cardiovascular: Positive for  chest pain. Negative for leg swelling.  Skin: Negative for rash.  Neurological: Negative for weakness and headaches.     Physical Exam Triage Vital Signs ED Triage Vitals [09/07/17 1841]  Enc Vitals Group     BP 111/74     Pulse Rate 91     Resp      Temp 98.6 F (37 C)     Temp Source Oral     SpO2 99 %     Weight      Height      Head Circumference      Peak Flow      Pain Score      Pain Loc      Pain Edu?      Excl. in [place]?    No data found.  Updated Vital Signs BP 111/74 (BP Location: Right Arm)   Pulse 91   Temp 98.6 F (37 C) (Oral)   LMP 09/02/2017 (Exact Date)   SpO2 99%   Visual  Acuity Right Eye Distance:   Left Eye Distance:   Bilateral Distance:    Right Eye Near:   Left Eye Near:    Bilateral Near:     Physical Exam  Constitutional: She is oriented to person, place, and time. She appears well-developed and well-nourished.  Non-toxic appearance. She does not appear ill. No distress.  Comfortable on the exam table in NAD  Neck: Normal range of motion.  Cardiovascular: Regular rhythm and normal pulses.  Pulmonary/Chest: Effort normal and breath sounds normal. No accessory muscle usage or stridor. No tachypnea. No respiratory distress.  Lymphadenopathy:    She has no cervical adenopathy.  Neurological: She is alert and oriented to person, place, and time.  Skin: Skin is warm and dry.  Psychiatric: She has a normal mood and affect. Her behavior is normal.  Nursing note and vitals reviewed.    UC Treatments / Results  Labs (all labs ordered are listed, but only abnormal results are displayed) Labs Reviewed - No data to display  EKG  EKG Interpretation None       Radiology No results found.  Procedures Procedures (including critical care time)  Medications Ordered in UC Medications - No data to display   Initial Impression / Assessment and Plan / UC Course  I have reviewed the triage vital signs and the nursing notes.  Pertinent labs & imaging results that were available during my care of the patient were reviewed by me and considered in my medical decision making (see chart for details).    Vitals are stable and lung exam is clear. She appears somewhat anxious about her recent sinus surgery. Instructed to use her inhaler at home every 6 hours, did provide Hycodan for night time cough, she may discuss medication for rhinorrhea with her surgeon though I suspect 1 week post op this is normal. Certainly go to the ED if her SOB worsens or is not subsiding. Reassurance given regarding exam and vitals.   Final Clinical Impressions(s) / UC  Diagnoses   Final diagnoses:  Cough  SOB (shortness of breath)    ED Discharge Orders        Ordered    HYDROcodone-homatropine (HYCODAN) 5-1.5 MG/5ML syrup  Every 6 hours PRN     09/07/17 1918       Controlled Substance Prescriptions [place] Controlled Substance Registry consulted? Not Applicable   Prudencio Pair 09/07/17 1926

## 2017-09-07 NOTE — ED Triage Notes (Signed)
Pt had sinus surgery one week ago.  Yesterday she was having some abdominal pain and nausea and today she reports chest tightness that comes and goes and last about 1 minute.

## 2017-09-07 NOTE — Discharge Instructions (Signed)
Your lungs sound normal, the cough and the way you are feeling may be from surgery. Will try this cough medication to help with night time cough. Check with your surgeon to see if you can do anything for the drainage. FU if worsens.

## 2017-11-15 DIAGNOSIS — G44221 Chronic tension-type headache, intractable: Secondary | ICD-10-CM | POA: Diagnosis not present

## 2017-11-23 ENCOUNTER — Encounter (HOSPITAL_COMMUNITY): Payer: Self-pay | Admitting: *Deleted

## 2017-11-23 ENCOUNTER — Ambulatory Visit (HOSPITAL_COMMUNITY)
Admission: EM | Admit: 2017-11-23 | Discharge: 2017-11-23 | Disposition: A | Discharge status: Home / Self Care | Payer: Medicaid Other | Attending: Emergency Medicine | Admitting: Emergency Medicine

## 2017-11-23 ENCOUNTER — Other Ambulatory Visit: Payer: Self-pay

## 2017-11-23 DIAGNOSIS — L732 Hidradenitis suppurativa: Secondary | ICD-10-CM

## 2017-11-23 HISTORY — DX: Cutaneous abscess, unspecified: L02.91

## 2017-11-23 MED ORDER — DOXYCYCLINE HYCLATE 100 MG PO CAPS: 100.0000 mg | ORAL_CAPSULE | Freq: Two times a day (BID) | ORAL | 0 refills | Status: AC

## 2017-11-23 NOTE — ED Provider Notes (Signed)
[place]    CSN: [id number] Arrival date & time: 11/23/17  1630     History   Chief Complaint Chief Complaint  Patient presents with  . Abscess    HPI Tyianna C Rankin-Jordan is a 22 y.o. female no contributing past medical history presenting today with abscess in her groin.  She states that she frequently has abscesses in her groin and her axilla.  She she does not normally require I&D of her abscesses, they normally rupture on their own and she squeezes the pus out of them.  She does heating pads and warm compresses.  She was since today because this 1 is larger than normal, it has been present for approximately 5 days.  HPI  Past Medical History:  Diagnosis Date  . Abscess   . Asthma   . Depression   . Panic attacks     Patient Active Problem List   Diagnosis Date Noted  . Routine screening for STI (sexually transmitted infection) 06/01/2014  . Dysmenorrhea 06/01/2014  . Encounter for management and injection of injectable progestin contraceptive 06/01/2014    Past Surgical History:  Procedure Laterality Date  . Leg mass removed    . MYRINGOTOMY WITH TUBE PLACEMENT    . TURBINATE REDUCTION Bilateral 08/30/2017    OB History    No data available       Home Medications    Prior to Admission medications   Medication Sig Start Date End Date Taking? Authorizing Provider  amitriptyline (ELAVIL) 10 MG tablet Take 10 mg by mouth at bedtime.   Yes [provider]  ipratropium (ATROVENT) 0.06 % nasal spray Place 2 sprays into both nostrils 4 (four) times daily. 07/07/17  Yes Yu, Amy V, PA-C  albuterol (PROVENTIL HFA;VENTOLIN HFA) 108 (90 BASE) MCG/ACT inhaler Inhale 2 puffs into the lungs every 6 (six) hours as needed for wheezing or shortness of breath. 02/28/15 03/31/15  Glynis Smiles, DO  amoxicillin-clavulanate (AUGMENTIN) 875-125 MG tablet Take 1 tablet by mouth every 12 (twelve) hours. 07/07/17   Tasia Catchings, Amy V, PA-C  cetirizine (ZYRTEC) 10 MG  tablet Take 1 tablet (10 mg total) by mouth daily. 07/07/17   Tasia Catchings, Amy V, PA-C  doxycycline (VIBRAMYCIN) 100 MG capsule Take 1 capsule (100 mg total) by mouth 2 (two) times daily for 10 days. 11/23/17 12/03/17  Wieters, Hallie C, PA-C  HYDROcodone-homatropine (HYCODAN) 5-1.5 MG/5ML syrup Take 5 mLs by mouth every 6 (six) hours as needed for cough. 09/07/17   Bjorn Pippin, PA-C  hydrOXYzine (ATARAX/VISTARIL) 25 MG tablet Take 25 mg by mouth 3 (three) times daily as needed.    [provider]  montelukast (SINGULAIR) 10 MG tablet Take 10 mg by mouth at bedtime.    [provider]  ondansetron (ZOFRAN ODT) 4 MG disintegrating tablet Take 1 tablet (4 mg total) by mouth every 8 (eight) hours as needed for nausea or vomiting. 06/04/16   Charlann Lange, PA-C  predniSONE (DELTASONE) 10 MG tablet Take 10 mg by mouth daily with breakfast.    [provider]    Family History No family history on file.  Social History Social History   Tobacco Use  . Smoking status: Never Smoker  . Smokeless tobacco: Never Used  Substance Use Topics  . Alcohol use: No  . Drug use: No     Allergies   Patient has no known allergies.   Review of Systems Review of Systems  Constitutional: Negative for fever.  Respiratory: Negative for  shortness of breath.   Cardiovascular: Negative for chest pain.  Gastrointestinal: Negative for abdominal pain, nausea and vomiting.  Genitourinary: Negative for dysuria, flank pain, genital sores, hematuria, menstrual problem, vaginal bleeding, vaginal discharge and vaginal pain.       Abscess  Skin: Negative for rash.  Neurological: Negative for dizziness, light-headedness and headaches.     Physical Exam Triage Vital Signs ED Triage Vitals  Enc Vitals Group     BP 11/23/17 1815 115/69     Pulse Rate 11/23/17 1815 86     Resp 11/23/17 1815 16     Temp 11/23/17 1815 99.1 F (37.3 C)     Temp Source 11/23/17 1815 Oral     SpO2 11/23/17 1815 100  %     Weight --      Height --      Head Circumference --      Peak Flow --      Pain Score 11/23/17 1816 4     Pain Loc --      Pain Edu? --      Excl. in [place]? --    No data found.  Updated Vital Signs BP 115/69   Pulse 86   Temp 99.1 F (37.3 C) (Oral)   Resp 16   LMP 10/30/2017 (Exact Date)   SpO2 100%   Visual Acuity Right Eye Distance:   Left Eye Distance:   Bilateral Distance:    Right Eye Near:   Left Eye Near:    Bilateral Near:     Physical Exam  Constitutional: She appears well-developed and well-nourished. No distress.  HENT:  Head: Normocephalic and atraumatic.  Eyes: Conjunctivae are normal.  Neck: Neck supple.  Cardiovascular: Normal rate and regular rhythm.  No murmur heard. Pulmonary/Chest: Effort normal and breath sounds normal. No respiratory distress.  Abdominal: Soft. There is no tenderness.  Genitourinary:  Genitourinary Comments: Patient with abscess approximately 1 cm x 2 cm to right mons, surrounding smaller indurated areas  Musculoskeletal: She exhibits no edema.  Neurological: She is alert.  Skin: Skin is warm and dry.  Psychiatric: She has a normal mood and affect.  Nursing note and vitals reviewed.    UC Treatments / Results  Labs (all labs ordered are listed, but only abnormal results are displayed) Labs Reviewed - No data to display  EKG  EKG Interpretation None       Radiology No results found.  Procedures Procedures (including critical care time)  Medications Ordered in UC Medications - No data to display   Initial Impression / Assessment and Plan / UC Course  I have reviewed the triage vital signs and the nursing notes.  Pertinent labs & imaging results that were available during my care of the patient were reviewed by me and considered in my medical decision making (see chart for details).     Patient possibly with hidradenitis suprrativa.  Abscess does appear to be drainable, patient declines I&D.  Will  send home with doxycycline for 10 days, warm compresses and warm soaks.  Advised to return if abscess continuing to increase in size/increasing pain, does not spontaneously drain in a few days.  Discussed strict return precautions. Patient verbalized understanding and is agreeable with plan.   Final Clinical Impressions(s) / UC Diagnoses   Final diagnoses:  Hidradenitis suppurativa    ED Discharge Orders        Ordered    doxycycline (VIBRAMYCIN) 100 MG capsule  2 times daily  11/23/17 1839       Controlled Substance Prescriptions [place] Controlled Substance Registry consulted? Not Applicable   Janith Lima, [place] 11/23/17 1849

## 2017-11-23 NOTE — ED Triage Notes (Signed)
C/O starting with pubic area and left axillary abscess approx 5 days ago with worsening to pubic area.

## 2017-11-23 NOTE — Discharge Instructions (Signed)
Please take doxycycline twice daily for the next 10 days.  Please do warm compresses multiple times a day and warm bath soaks with massage especially after it begins to drain.  Please return and a few days if it has not begun draining, it is increasing in size and pain.

## 2017-12-09 DIAGNOSIS — R51 Headache: Secondary | ICD-10-CM | POA: Diagnosis not present

## 2017-12-09 DIAGNOSIS — E559 Vitamin D deficiency, unspecified: Secondary | ICD-10-CM | POA: Diagnosis not present

## 2017-12-09 DIAGNOSIS — J45909 Unspecified asthma, uncomplicated: Secondary | ICD-10-CM | POA: Diagnosis not present

## 2017-12-09 DIAGNOSIS — L732 Hidradenitis suppurativa: Secondary | ICD-10-CM | POA: Diagnosis not present

## 2017-12-11 DIAGNOSIS — E875 Hyperkalemia: Secondary | ICD-10-CM | POA: Diagnosis not present

## 2018-01-22 DIAGNOSIS — J45909 Unspecified asthma, uncomplicated: Secondary | ICD-10-CM | POA: Diagnosis not present

## 2018-01-22 DIAGNOSIS — R51 Headache: Secondary | ICD-10-CM | POA: Diagnosis not present

## 2018-01-22 DIAGNOSIS — R300 Dysuria: Secondary | ICD-10-CM | POA: Diagnosis not present

## 2018-01-31 DIAGNOSIS — L732 Hidradenitis suppurativa: Secondary | ICD-10-CM | POA: Diagnosis not present

## 2018-01-31 DIAGNOSIS — J069 Acute upper respiratory infection, unspecified: Secondary | ICD-10-CM | POA: Diagnosis not present

## 2018-01-31 DIAGNOSIS — J45909 Unspecified asthma, uncomplicated: Secondary | ICD-10-CM | POA: Diagnosis not present

## 2018-01-31 DIAGNOSIS — J029 Acute pharyngitis, unspecified: Secondary | ICD-10-CM | POA: Diagnosis not present

## 2018-02-19 DIAGNOSIS — G44221 Chronic tension-type headache, intractable: Secondary | ICD-10-CM | POA: Diagnosis not present

## 2018-03-17 DIAGNOSIS — R51 Headache: Secondary | ICD-10-CM | POA: Diagnosis not present

## 2018-03-17 DIAGNOSIS — J45909 Unspecified asthma, uncomplicated: Secondary | ICD-10-CM | POA: Diagnosis not present

## 2018-03-17 DIAGNOSIS — J45998 Other asthma: Secondary | ICD-10-CM | POA: Diagnosis not present

## 2018-06-26 DIAGNOSIS — J45998 Other asthma: Secondary | ICD-10-CM | POA: Diagnosis not present

## 2018-06-26 DIAGNOSIS — Z131 Encounter for screening for diabetes mellitus: Secondary | ICD-10-CM | POA: Diagnosis not present

## 2018-06-26 DIAGNOSIS — R51 Headache: Secondary | ICD-10-CM | POA: Diagnosis not present

## 2018-06-26 DIAGNOSIS — R42 Dizziness and giddiness: Secondary | ICD-10-CM | POA: Diagnosis not present

## 2018-07-29 ENCOUNTER — Ambulatory Visit (HOSPITAL_COMMUNITY)
Admission: EM | Admit: 2018-07-29 | Discharge: 2018-07-29 | Disposition: A | Discharge status: Home / Self Care | Payer: Commercial Managed Care - PPO | Attending: Family Medicine | Admitting: Family Medicine

## 2018-07-29 ENCOUNTER — Encounter (HOSPITAL_COMMUNITY): Payer: Self-pay

## 2018-07-29 DIAGNOSIS — R6889 Other general symptoms and signs: Secondary | ICD-10-CM

## 2018-07-29 DIAGNOSIS — R0981 Nasal congestion: Secondary | ICD-10-CM

## 2018-07-29 DIAGNOSIS — R110 Nausea: Secondary | ICD-10-CM | POA: Diagnosis not present

## 2018-07-29 DIAGNOSIS — M791 Myalgia, unspecified site: Secondary | ICD-10-CM

## 2018-07-29 MED ORDER — IPRATROPIUM BROMIDE 0.06 % NA SOLN: 2.0000 | Freq: Four times a day (QID) | NASAL | 0 refills | Status: DC

## 2018-07-29 MED ORDER — ONDANSETRON 4 MG PO TBDP: 4.0000 mg | ORAL_TABLET | Freq: Three times a day (TID) | ORAL | 0 refills | Status: DC | PRN

## 2018-07-29 NOTE — Discharge Instructions (Signed)
Zofran for nausea and vomiting. Bland diet, advance as tolerated. You can start atrovent nasal spray, or get over the counter flonase/zyrtec-D for nasal congestion/drainage. You can use over the counter nasal saline rinse such as neti pot for nasal congestion. Keep hydrated, your urine should be clear to pale yellow in color. Tylenol/motrin for fever and pain. Monitor for any worsening of symptoms, chest pain, shortness of breath, wheezing, swelling of the throat, follow up for reevaluation.   For sore throat/cough try using a honey-based tea. Use 3 teaspoons of honey with juice squeezed from half lemon. Place shaved pieces of ginger into 1/2-1 cup of water and warm over stove top. Then mix the ingredients and repeat every 4 hours as needed.

## 2018-07-29 NOTE — ED Triage Notes (Signed)
Pt presents with general body aches, chills,  congestion, and nausea.

## 2018-07-29 NOTE — ED Provider Notes (Signed)
[place]    CSN: [id number] Arrival date & time: 07/29/18  1723     History   Chief Complaint Chief Complaint  Patient presents with  . Flu sxs    HPI Luma C Rankin-Jordan is a 22 y.o. female.   22 year old female comes in for 2-day history of URI symptoms.  States she has nasal congestion, body aches, hot flashes, nausea.  Had mild throat irritation without cough.  Subjective fevers.  She denies vomiting.  Has had some intermittent epigastric pain that is sharp, particularly when eating.  Has been able to tolerate fluids without difficulty.  Denies diarrhea.  Tylenol, last dose 3 hours ago.     Past Medical History:  Diagnosis Date  . Abscess   . Asthma   . Depression   . Panic attacks     Patient Active Problem List   Diagnosis Date Noted  . Routine screening for STI (sexually transmitted infection) 06/01/2014  . Dysmenorrhea 06/01/2014  . Encounter for management and injection of injectable progestin contraceptive 06/01/2014    Past Surgical History:  Procedure Laterality Date  . Leg mass removed    . MYRINGOTOMY WITH TUBE PLACEMENT    . TURBINATE REDUCTION Bilateral 08/30/2017    OB History   None      Home Medications    Prior to Admission medications   Medication Sig Start Date End Date Taking? Authorizing Provider  albuterol (PROVENTIL HFA;VENTOLIN HFA) 108 (90 BASE) MCG/ACT inhaler Inhale 2 puffs into the lungs every 6 (six) hours as needed for wheezing or shortness of breath. 02/28/15 03/31/15  Glynis Smiles, DO  amitriptyline (ELAVIL) 10 MG tablet Take 10 mg by mouth at bedtime.    [provider]  cetirizine (ZYRTEC) 10 MG tablet Take 1 tablet (10 mg total) by mouth daily. 07/07/17   Tasia Catchings, Amy V, PA-C  hydrOXYzine (ATARAX/VISTARIL) 25 MG tablet Take 25 mg by mouth 3 (three) times daily as needed.    [provider]  ipratropium (ATROVENT) 0.06 % nasal spray Place 2 sprays into both nostrils 4 (four) times daily.  07/29/18   Tasia Catchings, Amy V, PA-C  montelukast (SINGULAIR) 10 MG tablet Take 10 mg by mouth at bedtime.    [provider]  ondansetron (ZOFRAN ODT) 4 MG disintegrating tablet Take 1 tablet (4 mg total) by mouth every 8 (eight) hours as needed for nausea or vomiting. 07/29/18   Tasia Catchings, Amy V, PA-C  predniSONE (DELTASONE) 10 MG tablet Take 10 mg by mouth daily with breakfast.    [provider]    Family History History reviewed. No pertinent family history.  Social History Social History   Tobacco Use  . Smoking status: Never Smoker  . Smokeless tobacco: Never Used  Substance Use Topics  . Alcohol use: No  . Drug use: No     Allergies   Patient has no known allergies.   Review of Systems Review of Systems  Reason unable to perform ROS: See HPI as above.     Physical Exam Triage Vital Signs ED Triage Vitals  Enc Vitals Group     BP 07/29/18 1816 101/75     Pulse Rate 07/29/18 1816 90     Resp 07/29/18 1816 18     Temp 07/29/18 1816 98.8 F (37.1 C)     Temp Source 07/29/18 1816 Oral     SpO2 07/29/18 1816 99 %     Weight --      Height --  Head Circumference --      Peak Flow --      Pain Score 07/29/18 1828 6     Pain Loc --      Pain Edu? --      Excl. in [place]? --    No data found.  Updated Vital Signs BP 101/75 (BP Location: Left Arm)   Pulse 90   Temp 98.8 F (37.1 C) (Oral)   Resp 18   SpO2 99%   Physical Exam  Constitutional: She is oriented to person, place, and time. She appears well-developed and well-nourished. No distress.  HENT:  Head: Normocephalic and atraumatic.  Right Ear: Tympanic membrane, external ear and ear canal normal. Tympanic membrane is not erythematous and not bulging.  Left Ear: Tympanic membrane, external ear and ear canal normal. Tympanic membrane is not erythematous and not bulging.  Nose: Nose normal. Right sinus exhibits no maxillary sinus tenderness and no frontal sinus tenderness. Left sinus exhibits no  maxillary sinus tenderness and no frontal sinus tenderness.  Mouth/Throat: Uvula is midline, oropharynx is clear and moist and mucous membranes are normal.  Eyes: Pupils are equal, round, and reactive to light. Conjunctivae are normal.  Neck: Normal range of motion. Neck supple.  Cardiovascular: Normal rate, regular rhythm and normal heart sounds. Exam reveals no gallop and no friction rub.  No murmur heard. Pulmonary/Chest: Effort normal and breath sounds normal. No stridor. No respiratory distress. She has no decreased breath sounds. She has no wheezes. She has no rhonchi. She has no rales.  Abdominal: Soft. Bowel sounds are normal. There is no tenderness. There is no rigidity, no rebound, no guarding and no CVA tenderness.  Lymphadenopathy:    She has no cervical adenopathy.  Neurological: She is alert and oriented to person, place, and time.  Skin: Skin is warm and dry.  Psychiatric: She has a normal mood and affect. Her behavior is normal. Judgment normal.     UC Treatments / Results  Labs (all labs ordered are listed, but only abnormal results are displayed) Labs Reviewed - No data to display  EKG None  Radiology No results found.  Procedures Procedures (including critical care time)  Medications Ordered in UC Medications - No data to display  Initial Impression / Assessment and Plan / UC Course  I have reviewed the triage vital signs and the nursing notes.  Pertinent labs & imaging results that were available during my care of the patient were reviewed by me and considered in my medical decision making (see chart for details).    Discussed with patient history and exam most consistent with viral URI. Symptomatic treatment as needed. Push fluids. Return precautions given.   Final Clinical Impressions(s) / UC Diagnoses   Final diagnoses:  Nausea without vomiting  Flu-like symptoms    ED Prescriptions    Medication Sig Dispense Auth. Provider   ondansetron  (ZOFRAN ODT) 4 MG disintegrating tablet Take 1 tablet (4 mg total) by mouth every 8 (eight) hours as needed for nausea or vomiting. 10 tablet Yu, Amy V, PA-C   ipratropium (ATROVENT) 0.06 % nasal spray Place 2 sprays into both nostrils 4 (four) times daily. 15 mL Tobin Chad, [place] 07/29/18 1850

## 2018-12-17 DIAGNOSIS — J310 Chronic rhinitis: Secondary | ICD-10-CM | POA: Diagnosis not present

## 2018-12-17 DIAGNOSIS — H6983 Other specified disorders of Eustachian tube, bilateral: Secondary | ICD-10-CM | POA: Diagnosis not present

## 2018-12-17 DIAGNOSIS — H7203 Central perforation of tympanic membrane, bilateral: Secondary | ICD-10-CM | POA: Diagnosis not present

## 2018-12-17 DIAGNOSIS — H6123 Impacted cerumen, bilateral: Secondary | ICD-10-CM | POA: Diagnosis not present

## 2019-01-30 DIAGNOSIS — J45909 Unspecified asthma, uncomplicated: Secondary | ICD-10-CM | POA: Diagnosis not present

## 2019-01-30 DIAGNOSIS — N926 Irregular menstruation, unspecified: Secondary | ICD-10-CM | POA: Diagnosis not present

## 2019-01-30 DIAGNOSIS — Z5181 Encounter for therapeutic drug level monitoring: Secondary | ICD-10-CM | POA: Diagnosis not present

## 2019-01-30 DIAGNOSIS — N644 Mastodynia: Secondary | ICD-10-CM | POA: Diagnosis not present

## 2020-04-06 ENCOUNTER — Ambulatory Visit: Payer: Commercial Managed Care - PPO | Admitting: Allergy

## 2020-04-06 ENCOUNTER — Other Ambulatory Visit: Payer: Self-pay

## 2020-04-06 ENCOUNTER — Encounter: Payer: Self-pay | Admitting: Allergy

## 2020-04-06 VITALS — BP 106/72 | HR 102 | Temp 98.5°F | Resp 17 | Ht 63.11 in | Wt 127.0 lb

## 2020-04-06 DIAGNOSIS — Z8669 Personal history of other diseases of the nervous system and sense organs: Secondary | ICD-10-CM

## 2020-04-06 DIAGNOSIS — H1013 Acute atopic conjunctivitis, bilateral: Secondary | ICD-10-CM

## 2020-04-06 DIAGNOSIS — J4540 Moderate persistent asthma, uncomplicated: Secondary | ICD-10-CM | POA: Diagnosis not present

## 2020-04-06 DIAGNOSIS — J3089 Other allergic rhinitis: Secondary | ICD-10-CM

## 2020-04-06 DIAGNOSIS — T162XXA Foreign body in left ear, initial encounter: Secondary | ICD-10-CM

## 2020-04-06 DIAGNOSIS — T7800XA Anaphylactic reaction due to unspecified food, initial encounter: Secondary | ICD-10-CM

## 2020-04-06 MED ORDER — OLOPATADINE HCL 0.2 % OP SOLN: OPHTHALMIC | 5 refills | Status: DC

## 2020-04-06 MED ORDER — EPINEPHRINE 0.3 MG/0.3ML IJ SOAJ: 0.3000 mg | Freq: Once | INTRAMUSCULAR | 1 refills | Status: AC

## 2020-04-06 NOTE — Patient Instructions (Addendum)
Allergies  - environmental allergy skin testing is positive to grass pollen, molds, dust mites  - allergen avoidance measures discussed/handouts provided  - can continue use of Xyzal however would decrease dose down to 2.5mg  (1/2 tab) or take whole tab 3-4 times a week.  Xyzal and other antihistamines can be drying.    - use nasal steroid spray like Flonase, Nasacort or Rhinocort 2 sprays each nostril for 1-2 weeks at a time for nasal congestion.    - recommend use of nasal saline spray to help keep nose moisturized and not dry  -For itchy, watery or red eyes can use olopatadine 0.2% 1 drop each eye daily as needed  - if medication management is not effective enough then you would be eligible for allergy injections (immunotherapy) which is a 3-5 year process that can "re-train" the body to be tolerant to the allergens to decrease symptoms and need for medication management.    Food allergy  - select food allergy skin testing is negative to shellfish panel.  Will obtain shellfish IgE panel.    - continue avoidance of shellfish in diet at this time  - have access to self-injectable epinephrine (Epipen or AuviQ) 0.3mg  at all times  - follow emergency action plan in case of allergic reaction  History of asthma  - have access to albuterol inhaler 2 puffs every 4-6 hours as needed for cough/wheeze/shortness of breath/chest tightness.  May use 15-20 minutes prior to activity.   Monitor frequency of use.     Foreign body in ear  - there is a white foreign body in left ear canal  - recommend calling Dr. Benjamine Mola (your ENT) for appointment for removal  - if this object is rubbing against the tympanic membrane and can cause pain  Recurrent ear infection  - appears to be related to eustachian tube dysfunction   - will obtain immunocompetence work-up to ensure your immune system is adequately fighting off infections  Follow-up 4 months or sooner if needed

## 2020-04-06 NOTE — Progress Notes (Signed)
New Patient Note  RE: Angela Ramos MRN: [id number] DOB: 1996-09-12 Date of Office Visit: 04/06/2020  Referring provider: Trey Sailors, PA Primary care provider: Benito Mccreedy, MD  Chief Complaint: ear problem, crab reaction   History of present illness: Angela Ramos is a 24 y.o. female presenting today for consultation for allergies.  History obtained by Dr. Pete Pelt medicine resident.  [name] states that approximately 2-3 months ago ate a crab cake containing both shrimp and crab. Within several minutes of eating the crab cake, she began to experience diffuse, whole body pruritis, palpitations, and throat tightness. She was able to maintain her secretions. She states that 10-15 minutes after her symptoms started, she took a Benadryl tablet which alleviated many of her symptoms. She still had pruritis, but it was less severe. She has avoided shellfish in general since this incident. Patient states that this was her worst event concerning shellfish, but her symptoms began last year. Over the past year, when she eats crab, she has GI upset, and with shrimp she experiences mild itching. Before this past year, she was able to eat both shrimp and crab without issue. She states that she is also able to eat fresh and saltwater fish without any incidents.   For her seasonal allergies, she experiences runny nose, nasal congestion, and itching/watery eyes. Her symptoms are typically worse in the late winter throughout spring. She takes Xyzal for her allergies that works well for her. She states that she has had surgery on her turbinates due to her history of obstruction. She states that her nose for the past several months has been very dry, but she has not had any nosebleeds. She states that she may be allergic to mold, due to having a bathroom with high humidity with a long history of mold exposure.   Lastly, she has had chronic ear infections secondary to  Eustachian tube disfunction. She has had myringotomy tubes placed in 2017. She has had 4-5 ear infections in the past year. She states that her left ear is the most affected side. She follows with ENT and is being treated with antibiotic ear drops.  She states however that she has not had any ENT follow-up this year.  Review of systems: Review of Systems  Constitutional: Negative.   HENT:       See HPI  Eyes:       See HPI  Respiratory: Negative.   Cardiovascular: Negative.   Gastrointestinal: Negative.   Musculoskeletal: Negative.   Skin: Negative.   Neurological: Negative.     All other systems negative unless noted above in HPI  Past medical history: Past Medical History:  Diagnosis Date  . Abscess   . Asthma   . Depression   . Panic attacks     Past surgical history: Past Surgical History:  Procedure Laterality Date  . Leg mass removed    . MYRINGOTOMY WITH TUBE PLACEMENT    . TURBINATE REDUCTION Bilateral 08/30/2017    Family history:  Family History  Problem Relation Age of Onset  . Diabetes Maternal Aunt   . Hypertension Maternal Aunt   . Hypertension Maternal Grandmother     Social history: Lives in a home with carpeting in the family room with gas heating and window and fan cooling.  No pets in the home but there is a dog outside the home.  There is concern for water damage and mildew in the home as well as roaches in the  home.  She is a Product manager at [employer].  She denies a smoking history.  Medication List: Current Outpatient Medications  Medication Sig Dispense Refill  . fluticasone (FLONASE) 50 MCG/ACT nasal spray Place into both nostrils daily.    [name] levocetirizine (XYZAL) 5 MG tablet Take 5 mg by mouth every evening.    [name] albuterol (PROVENTIL HFA;VENTOLIN HFA) 108 (90 BASE) MCG/ACT inhaler Inhale 2 puffs into the lungs every 6 (six) hours as needed for wheezing or shortness of breath. 1 Inhaler 0  . amitriptyline (ELAVIL) 10 MG tablet Take 10 mg by  mouth at bedtime. (Patient not taking: Reported on 04/06/2020)    . cetirizine (ZYRTEC) 10 MG tablet Take 1 tablet (10 mg total) by mouth daily. (Patient not taking: Reported on 04/06/2020) 30 tablet 0  . hydrOXYzine (ATARAX/VISTARIL) 25 MG tablet Take 25 mg by mouth 3 (three) times daily as needed. (Patient not taking: Reported on 04/06/2020)    . ipratropium (ATROVENT) 0.06 % nasal spray Place 2 sprays into both nostrils 4 (four) times daily. (Patient not taking: Reported on 04/06/2020) 15 mL 0  . montelukast (SINGULAIR) 10 MG tablet Take 10 mg by mouth at bedtime. (Patient not taking: Reported on 04/06/2020)    . ondansetron (ZOFRAN ODT) 4 MG disintegrating tablet Take 1 tablet (4 mg total) by mouth every 8 (eight) hours as needed for nausea or vomiting. (Patient not taking: Reported on 04/06/2020) 10 tablet 0  . predniSONE (DELTASONE) 10 MG tablet Take 10 mg by mouth daily with breakfast. (Patient not taking: Reported on 04/06/2020)     No current facility-administered medications for this visit.    Known medication allergies: No Known Allergies   Physical examination: Blood pressure 106/72, pulse (!) 102, temperature 98.5 F (36.9 C), temperature source Temporal, resp. rate 17, height 5' 3.11" (1.603 m), weight 127 lb (57.6 kg), SpO2 98 %.  General: Alert, interactive, in no acute distress. HEENT: PERRLA, TMs pearly gray, left canal with a white solid appearing foreign object sitting at the base of the canal near the TM, turbinates non-edematous without discharge, post-pharynx non erythematous. Neck: Supple without lymphadenopathy. Lungs: Clear to auscultation without wheezing, rhonchi or rales. {no increased work of breathing. CV: Normal S1, S2 without murmurs. Abdomen: Nondistended, nontender. Skin: Warm and dry, without lesions or rashes. Extremities:  No clubbing, cyanosis or edema. Neuro:   Grossly intact.  Diagnositics/Labs:  Allergy testing: Environmental allergy skin prick testing is  positive to [place], [place] blue, sweet Vernal, Timothy, Alternaria, Helminthosporium, Curvularia, Fusarium, both dust mites. Intradermal testing is positive to mold mix 2. Skin prick testing to shellfish panel is negative. Allergy testing results were read and interpreted by provider, documented by clinical staff.   Assessment and plan:   Allergic rhinitis  - environmental allergy skin testing is positive to grass pollen, molds, dust mites  - allergen avoidance measures discussed/handouts provided  - can continue use of Xyzal however would decrease dose down to 2.5mg  (1/2 tab) or take whole tab 3-4 times a week.  Xyzal and other antihistamines can be drying.    - use nasal steroid spray like Flonase, Nasacort or Rhinocort 2 sprays each nostril for 1-2 weeks at a time for nasal congestion.    - recommend use of nasal saline spray to help keep nose moisturized and not dry  -For itchy, watery or red eyes can use olopatadine 0.2% 1 drop each eye daily as needed  - if medication management is not effective enough then you  would be eligible for allergy injections (immunotherapy) which is a 3-5 year process that can "re-train" the body to be tolerant to the allergens to decrease symptoms and need for medication management.    Anaphylaxis due to food  - select food allergy skin testing is negative to shellfish panel.  Will obtain shellfish IgE panel.    - continue avoidance of shellfish in diet at this time  - have access to self-injectable epinephrine (Epipen or AuviQ) 0.3mg  at all times  - follow emergency action plan in case of allergic reaction  History of asthma, moderate persistent  - have access to albuterol inhaler 2 puffs every 4-6 hours as needed for cough/wheeze/shortness of breath/chest tightness.  May use 15-20 minutes prior to activity.   Monitor frequency of use.     Foreign body in ear  - there is a white foreign body in left ear canal  - recommend calling Dr. Benjamine Mola (your ENT) for  appointment for removal  - if this object is rubbing against the tympanic membrane and can cause pain  Recurrent ear infection  - appears to be related to eustachian tube dysfunction   - will obtain immunocompetence work-up to ensure your immune system is adequately fighting off infections  Follow-up 4 months or sooner if needed  I appreciate the opportunity to take part in Perl's care. Please do not hesitate to contact me with questions.  Sincerely,   Prudy Feeler, MD Allergy/Immunology [facility]

## 2020-04-06 NOTE — Addendum Note (Signed)
Addended by: Guy Franco on: 04/06/2020 02:12 PM   Modules accepted: Orders

## 2020-04-16 LAB — CBC WITH DIFFERENTIAL/PLATELET
Basophils Absolute: 0.1 10*3/uL (ref 0.0–0.2)
Basos: 1 %
EOS (ABSOLUTE): 0.1 10*3/uL (ref 0.0–0.4)
Eos: 1 %
Hematocrit: 48.4 % — ABNORMAL HIGH (ref 34.0–46.6)
Hemoglobin: 15.6 g/dL (ref 11.1–15.9)
Immature Grans (Abs): 0 10*3/uL (ref 0.0–0.1)
Immature Granulocytes: 0 %
Lymphocytes Absolute: 1.7 10*3/uL (ref 0.7–3.1)
Lymphs: 19 %
MCH: 28.5 pg (ref 26.6–33.0)
MCHC: 32.2 g/dL (ref 31.5–35.7)
MCV: 88 fL (ref 79–97)
Monocytes Absolute: 0.5 10*3/uL (ref 0.1–0.9)
Monocytes: 6 %
Neutrophils Absolute: 6.6 10*3/uL (ref 1.4–7.0)
Neutrophils: 73 %
Platelets: 150 10*3/uL (ref 150–450)
RBC: 5.48 x10E6/uL — ABNORMAL HIGH (ref 3.77–5.28)
RDW: 13.4 % (ref 11.7–15.4)
WBC: 9 10*3/uL (ref 3.4–10.8)

## 2020-04-16 LAB — ALLERGEN PROFILE, SHELLFISH
Clam IgE: <0.1 kU/L
F023-IgE Crab: <0.1 kU/L
F080-IgE Lobster: <0.1 kU/L
F290-IgE Oyster: <0.1 kU/L
Scallop IgE: <0.1 kU/L
Shrimp IgE: <0.1 kU/L

## 2020-04-16 LAB — STREP PNEUMONIAE 23 SEROTYPES IGG
Pneumo Ab Type 1*: <0.1 ug/mL — ABNORMAL LOW (ref >1.3)
Pneumo Ab Type 12 (12F)*: <0.1 ug/mL — ABNORMAL LOW (ref >1.3)
Pneumo Ab Type 14*: <0.1 ug/mL — ABNORMAL LOW (ref >1.3)
Pneumo Ab Type 17 (17F)*: <0.1 ug/mL — ABNORMAL LOW (ref >1.3)
Pneumo Ab Type 19 (19F)*: 0.6 ug/mL — ABNORMAL LOW (ref >1.3)
Pneumo Ab Type 2*: <0.1 ug/mL — ABNORMAL LOW (ref >1.3)
Pneumo Ab Type 20*: 0.2 ug/mL — ABNORMAL LOW (ref >1.3)
Pneumo Ab Type 22 (22F)*: <0.1 ug/mL — ABNORMAL LOW (ref >1.3)
Pneumo Ab Type 23 (23F)*: <0.1 ug/mL — ABNORMAL LOW (ref >1.3)
Pneumo Ab Type 26 (6B)*: <0.1 ug/mL — ABNORMAL LOW (ref >1.3)
Pneumo Ab Type 3*: 0.9 ug/mL — ABNORMAL LOW (ref >1.3)
Pneumo Ab Type 34 (10A)*: <0.1 ug/mL — ABNORMAL LOW (ref >1.3)
Pneumo Ab Type 4*: 0.4 ug/mL — ABNORMAL LOW (ref >1.3)
Pneumo Ab Type 43 (11A)*: <0.1 ug/mL — ABNORMAL LOW (ref >1.3)
Pneumo Ab Type 5*: <0.1 ug/mL — ABNORMAL LOW (ref >1.3)
Pneumo Ab Type 51 (7F)*: <0.1 ug/mL — ABNORMAL LOW (ref >1.3)
Pneumo Ab Type 54 (15B)*: <0.1 ug/mL — ABNORMAL LOW (ref >1.3)
Pneumo Ab Type 56 (18C)*: 0.1 ug/mL — ABNORMAL LOW (ref >1.3)
Pneumo Ab Type 57 (19A)*: 0.9 ug/mL — ABNORMAL LOW (ref >1.3)
Pneumo Ab Type 68 (9V)*: 0.3 ug/mL — ABNORMAL LOW (ref >1.3)
Pneumo Ab Type 70 (33F)*: <0.1 ug/mL — ABNORMAL LOW (ref >1.3)
Pneumo Ab Type 8*: <0.1 ug/mL — ABNORMAL LOW (ref >1.3)
Pneumo Ab Type 9 (9N)*: <0.1 ug/mL — ABNORMAL LOW (ref >1.3)

## 2020-04-16 LAB — DIPHTHERIA / TETANUS ANTIBODY PANEL
Diphtheria Ab: <0.1 IU/mL — ABNORMAL LOW (ref <0.10)
Tetanus Ab, IgG: <0.1 IU/mL — ABNORMAL LOW (ref <0.10)

## 2020-04-16 LAB — IGG, IGA, IGM
IgA/Immunoglobulin A, Serum: 466 mg/dL — ABNORMAL HIGH (ref 87–352)
IgG (Immunoglobin G), Serum: 1323 mg/dL (ref 586–1602)
IgM (Immunoglobulin M), Srm: 103 mg/dL (ref 26–217)

## 2020-04-16 LAB — IGE: IgE (Immunoglobulin E), Serum: 303 IU/mL (ref 6–495)

## 2020-04-27 ENCOUNTER — Telehealth: Payer: Self-pay | Admitting: Allergy

## 2020-04-27 NOTE — Telephone Encounter (Signed)
Called and spoke to patient and she has been informed of her lab results.

## 2020-04-27 NOTE — Telephone Encounter (Signed)
Patient called and states she has not heard from anyone regarding her lab results. Patient informed we tried to contact her but her voicemail was not set up. Patient would like a call back for results.

## 2020-08-11 ENCOUNTER — Ambulatory Visit: Payer: Medicaid Other | Admitting: Allergy

## 2021-02-20 ENCOUNTER — Encounter (HOSPITAL_COMMUNITY): Payer: Self-pay | Admitting: Emergency Medicine

## 2021-02-20 ENCOUNTER — Other Ambulatory Visit: Payer: Self-pay

## 2021-02-20 ENCOUNTER — Ambulatory Visit (HOSPITAL_COMMUNITY)
Admission: EM | Admit: 2021-02-20 | Discharge: 2021-02-20 | Disposition: A | Discharge status: Home / Self Care | Payer: Medicaid Other | Attending: Internal Medicine | Admitting: Internal Medicine

## 2021-02-20 DIAGNOSIS — K2900 Acute gastritis without bleeding: Secondary | ICD-10-CM

## 2021-02-20 LAB — POCT URINALYSIS DIPSTICK, ED / UC
Bilirubin Urine: NEGATIVE
Glucose, UA: NEGATIVE mg/dL
Ketones, ur: 15 mg/dL — AB
Leukocytes,Ua: NEGATIVE
Nitrite: NEGATIVE
Protein, ur: NEGATIVE mg/dL
Specific Gravity, Urine: 1.015 (ref 1.005–1.030)
Urobilinogen, UA: 0.2 mg/dL (ref 0.0–1.0)
pH: 5 (ref 5.0–8.0)

## 2021-02-20 LAB — POC URINE PREG, ED: Preg Test, Ur: NEGATIVE

## 2021-02-20 MED ORDER — PANTOPRAZOLE SODIUM 20 MG PO TBEC: 20.0000 mg | DELAYED_RELEASE_TABLET | Freq: Every day | ORAL | 0 refills | Status: DC

## 2021-02-20 NOTE — Discharge Instructions (Signed)
Medications as directed Make sure you take ibuprofen with food If symptoms worsen please return to urgent care to be reevaluated After 30 days of Protonix trial if you still have symptoms you may benefit from gastroenterology evaluation.

## 2021-02-20 NOTE — ED Provider Notes (Addendum)
[facility]    CSN: [id number] Arrival date & time: 02/20/21  1553      History   Chief Complaint Chief Complaint  Patient presents with  . Abdominal Pain    HPI Angela Ramos is a 25 y.o. female comes to the urgent care with epigastric abdominal pain of 3 days duration.  Patient says pain started 3 days ago and has been persistent.  Epigastric pain is burning in nature and aggravated by eating food.  She has had some nausea but no vomiting.  No abdominal bloating or distention.  She started her menstrual period and she has had some lower abdominal cramps which is not unusual for her.  No diarrhea.  No known relieving factors for the abdominal pain.  No radiation of pain to the back..   Patient has been taking ibuprofen for menstrual cramps over the past couple of days.  Epigastric pain however started before she started taking ibuprofen.  HPI  Past Medical History:  Diagnosis Date  . Abscess   . Asthma   . Depression   . Panic attacks     Patient Active Problem List   Diagnosis Date Noted  . Routine screening for STI (sexually transmitted infection) 06/01/2014  . Dysmenorrhea 06/01/2014  . Encounter for management and injection of injectable progestin contraceptive 06/01/2014    Past Surgical History:  Procedure Laterality Date  . Leg mass removed    . MYRINGOTOMY WITH TUBE PLACEMENT    . TURBINATE REDUCTION Bilateral 08/30/2017    OB History   No obstetric history on file.      Home Medications    Prior to Admission medications   Medication Sig Start Date End Date Taking? Authorizing Provider  fluticasone (FLONASE) 50 MCG/ACT nasal spray Place into both nostrils daily.   Yes [provider]  levocetirizine (XYZAL) 5 MG tablet Take 5 mg by mouth every evening.   Yes [provider]  pantoprazole (PROTONIX) 20 MG tablet Take 1 tablet (20 mg total) by mouth daily. 02/20/21 03/22/21 Yes Solan Vosler, Myrene Galas, MD  albuterol  (PROVENTIL HFA;VENTOLIN HFA) 108 (90 BASE) MCG/ACT inhaler Inhale 2 puffs into the lungs every 6 (six) hours as needed for wheezing or shortness of breath. 02/28/15 03/31/15  Glynis Smiles, DO    Family History Family History  Problem Relation Age of Onset  . Diabetes Maternal Aunt   . Hypertension Maternal Aunt   . Hypertension Maternal Grandmother     Social History Social History   Tobacco Use  . Smoking status: Never Smoker  . Smokeless tobacco: Never Used  Vaping Use  . Vaping Use: Never used  Substance Use Topics  . Alcohol use: No  . Drug use: No     Allergies   Patient has no known allergies.   Review of Systems Review of Systems  Gastrointestinal: Positive for abdominal pain and nausea. Negative for blood in stool, constipation and vomiting.  Neurological: Negative.  Negative for weakness, numbness and headaches.     Physical Exam Triage Vital Signs ED Triage Vitals  Enc Vitals Group     BP 02/20/21 1704 111/61     Pulse Rate 02/20/21 1704 97     Resp 02/20/21 1704 18     Temp 02/20/21 1704 99 F (37.2 C)     Temp Source 02/20/21 1704 Oral     SpO2 02/20/21 1704 100 %     Weight --      Height --  Head Circumference --      Peak Flow --      Pain Score 02/20/21 1700 4     Pain Loc --      Pain Edu? --      Excl. in [place]? --    No data found.  Updated Vital Signs BP 111/61 (BP Location: Right Arm)   Pulse 97   Temp 99 F (37.2 C) (Oral)   Resp 18   LMP 02/17/2021   SpO2 100%   Visual Acuity Right Eye Distance:   Left Eye Distance:   Bilateral Distance:    Right Eye Near:   Left Eye Near:    Bilateral Near:     Physical Exam Vitals and nursing note reviewed.  Constitutional:      General: She is not in acute distress.    Appearance: She is not ill-appearing.  Cardiovascular:     Rate and Rhythm: Normal rate and regular rhythm.     Heart sounds: Normal heart sounds.  Pulmonary:     Effort: Pulmonary effort is normal.      Breath sounds: Normal breath sounds.  Abdominal:     Palpations: Abdomen is soft.     Tenderness: There is abdominal tenderness in the epigastric area and suprapubic area.     Hernia: No hernia is present.  Neurological:     Mental Status: She is alert.      UC Treatments / Results  Labs (all labs ordered are listed, but only abnormal results are displayed) Labs Reviewed  POCT URINALYSIS DIPSTICK, ED / UC - Abnormal; Notable for the following components:      Result Value   Ketones, ur 15 (*)    Hgb urine dipstick LARGE (*)    All other components within normal limits  POC URINE PREG, ED    EKG   Radiology No results found.  Procedures Procedures (including critical care time)  Medications Ordered in UC Medications - No data to display  Initial Impression / Assessment and Plan / UC Course  I have reviewed the triage vital signs and the nursing notes.  Pertinent labs & imaging results that were available during my care of the patient were reviewed by me and considered in my medical decision making (see chart for details).     1.  Acute gastritis without hemorrhage: Protonix 20 mg orally daily Please take ibuprofen with food If symptoms worsen please return to the urgent care to be evaluated   Final Clinical Impressions(s) / UC Diagnoses   Final diagnoses:  Acute superficial gastritis without hemorrhage     Discharge Instructions     Medications as directed Make sure you take ibuprofen with food If symptoms worsen please return to urgent care to be reevaluated After 30 days of Protonix trial if you still have symptoms you may benefit from gastroenterology evaluation.   ED Prescriptions    Medication Sig Dispense Auth. Provider   pantoprazole (PROTONIX) 20 MG tablet Take 1 tablet (20 mg total) by mouth daily. 30 tablet Ireta Pullman, Myrene Galas, MD     PDMP not reviewed this encounter.   Chase Picket, MD 02/20/21 1851    Chase Picket,  MD 02/20/21 [phone]

## 2021-02-20 NOTE — ED Triage Notes (Addendum)
Abdominal pain and nauseated sing Friday.  No vomiting.    lmp-currently menstruating.    Denies urinary symptoms, but thinks she urinates too often

## 2021-08-14 ENCOUNTER — Encounter (HOSPITAL_COMMUNITY): Payer: Self-pay

## 2021-08-14 ENCOUNTER — Emergency Department (HOSPITAL_COMMUNITY): Payer: 59

## 2021-08-14 ENCOUNTER — Emergency Department (HOSPITAL_COMMUNITY)
Admission: EM | Admit: 2021-08-14 | Discharge: 2021-08-14 | Disposition: A | Discharge status: Home / Self Care | Payer: 59 | Attending: Emergency Medicine | Admitting: Emergency Medicine

## 2021-08-14 DIAGNOSIS — J45909 Unspecified asthma, uncomplicated: Secondary | ICD-10-CM | POA: Insufficient documentation

## 2021-08-14 DIAGNOSIS — R1031 Right lower quadrant pain: Secondary | ICD-10-CM | POA: Insufficient documentation

## 2021-08-14 DIAGNOSIS — D3502 Benign neoplasm of left adrenal gland: Secondary | ICD-10-CM | POA: Diagnosis not present

## 2021-08-14 DIAGNOSIS — R1030 Lower abdominal pain, unspecified: Secondary | ICD-10-CM | POA: Diagnosis present

## 2021-08-14 DIAGNOSIS — N83201 Unspecified ovarian cyst, right side: Secondary | ICD-10-CM | POA: Diagnosis not present

## 2021-08-14 DIAGNOSIS — N9489 Other specified conditions associated with female genital organs and menstrual cycle: Secondary | ICD-10-CM | POA: Diagnosis not present

## 2021-08-14 LAB — COMPREHENSIVE METABOLIC PANEL
ALT: 11 U/L (ref 0–44)
AST: 21 U/L (ref 15–41)
Albumin: 4.6 g/dL (ref 3.5–5.0)
Alkaline Phosphatase: 70 U/L (ref 38–126)
Anion gap: 8 (ref 5–15)
BUN: 9 mg/dL (ref 6–20)
CO2: 27 mmol/L (ref 22–32)
Calcium: 9.2 mg/dL (ref 8.9–10.3)
Chloride: 102 mmol/L (ref 98–111)
Creatinine, Ser: 0.86 mg/dL (ref 0.44–1.00)
GFR, Estimated: >60 mL/min (ref >60)
Glucose, Bld: 101 mg/dL — ABNORMAL HIGH (ref 70–99)
Potassium: 3.8 mmol/L (ref 3.5–5.1)
Sodium: 137 mmol/L (ref 135–145)
Total Bilirubin: 0.6 mg/dL (ref 0.3–1.2)
Total Protein: 8.1 g/dL (ref 6.5–8.1)

## 2021-08-14 LAB — I-STAT BETA HCG BLOOD, ED (MC, WL, AP ONLY): I-stat hCG, quantitative: <5 m[IU]/mL (ref <5)

## 2021-08-14 LAB — URINALYSIS, ROUTINE W REFLEX MICROSCOPIC
Bilirubin Urine: NEGATIVE
Glucose, UA: 50 mg/dL — AB
Hgb urine dipstick: NEGATIVE
Ketones, ur: NEGATIVE mg/dL
Leukocytes,Ua: NEGATIVE
Nitrite: NEGATIVE
Protein, ur: NEGATIVE mg/dL
Specific Gravity, Urine: 1.014 (ref 1.005–1.030)
pH: 7 (ref 5.0–8.0)

## 2021-08-14 LAB — CBC
HCT: 40.9 % (ref 36.0–46.0)
Hemoglobin: 13.8 g/dL (ref 12.0–15.0)
MCH: 29 pg (ref 26.0–34.0)
MCHC: 33.7 g/dL (ref 30.0–36.0)
MCV: 85.9 fL (ref 80.0–100.0)
Platelets: 308 10*3/uL (ref 150–400)
RBC: 4.76 MIL/uL (ref 3.87–5.11)
RDW: 13.4 % (ref 11.5–15.5)
WBC: 12.5 10*3/uL — ABNORMAL HIGH (ref 4.0–10.5)
nRBC: 0 % (ref 0.0–0.2)

## 2021-08-14 LAB — LIPASE, BLOOD: Lipase: 24 U/L (ref 11–51)

## 2021-08-14 MED ORDER — KETOROLAC TROMETHAMINE 15 MG/ML IJ SOLN
15.0000 mg | Freq: Once | INTRAMUSCULAR | Status: AC
  Administered 2021-08-14: 15 mg via INTRAVENOUS
  Filled 2021-08-14: qty 1

## 2021-08-14 MED ORDER — NAPROXEN 500 MG PO TABS: 500.0000 mg | ORAL_TABLET | Freq: Two times a day (BID) | ORAL | 0 refills | Status: DC

## 2021-08-14 MED ORDER — IOHEXOL 350 MG/ML SOLN
75.0000 mL | Freq: Once | INTRAVENOUS | Status: AC | PRN
  Administered 2021-08-14: 75 mL via INTRAVENOUS

## 2021-08-14 NOTE — ED Triage Notes (Signed)
Pt arrived via POV, c/o lower abd pain with some nausea since yesterday. Denies any vomiting or diarrhea or urinary sx.

## 2021-08-14 NOTE — Discharge Instructions (Addendum)
Please read and follow all provided instructions.  Your diagnoses today include:  1. Cyst of right ovary   2. RLQ abdominal pain     Tests performed today include: Blood cell counts and platelets: high white blood cell count Kidney and liver function tests: were normal Pancreas function test (called lipase): was normal Urine test to look for infection: did not show infection A blood or urine test for pregnancy (women only): was negative CT scan: showed normal appendix and large ovarian cyst Ultrasound Vital signs. See below for your results today.   Medications prescribed:  Naproxen - anti-inflammatory pain medication Do not exceed 500mg  naproxen every 12 hours, take with food  You have been prescribed an anti-inflammatory medication or NSAID. Take with food. Take smallest effective dose for the shortest duration needed for your pain. Stop taking if you experience stomach pain or vomiting.   Take any prescribed medications only as directed.  Home care instructions:  Follow any educational materials contained in this packet.  Follow-up instructions: Please follow-up with the GYN referral. Call for an appointment.   Return instructions:  SEEK IMMEDIATE MEDICAL ATTENTION IF: The pain does not go away or becomes severe  A temperature above 101F develops  Repeated vomiting occurs (multiple episodes)  The pain becomes localized to portions of the abdomen. The right side could possibly be appendicitis. In an adult, the left lower portion of the abdomen could be colitis or diverticulitis.  Blood is being passed in stools or vomit (bright red or black tarry stools)  You develop chest pain, difficulty breathing, dizziness or fainting, or become confused, poorly responsive, or inconsolable (young children) If you have any other emergent concerns regarding your health  Additional Information: Abdominal (belly) pain can be caused by many things. Your caregiver performed an examination  and possibly ordered blood/urine tests and imaging (CT scan, x-rays, ultrasound). Many cases can be observed and treated at home after initial evaluation in the emergency department. Even though you are being discharged home, abdominal pain can be unpredictable. Therefore, you need a repeated exam if your pain does not resolve, returns, or worsens. Most patients with abdominal pain don't have to be admitted to the hospital or have surgery, but serious problems like appendicitis and gallbladder attacks can start out as nonspecific pain. Many abdominal conditions cannot be diagnosed in one visit, so follow-up evaluations are very important.  Your vital signs today were: BP 125/73   Pulse 95   Temp 98.3 F (36.8 C) (Oral)   Resp 18   Ht 5\' 1"  (1.549 m)   Wt 54 kg   SpO2 100%   BMI 22.48 kg/m  If your blood pressure (bp) was elevated above 135/85 this visit, please have this repeated by your doctor within one month. --------------    IMPRESSION:  1. A 4.9 cm right and 2.3 cm left ovarian cystic lesions suggestive  of endometriomas. Recommend repeat ultrasound in 6-12 weeks. If not  surgically removed, recommend follow-up Q 1 year after that.  2. Question several other left and right ovarian lesions not well  visualized on this study. Recommend repeat ultrasound in 6-12 weeks.  Consider gynecologic consultation

## 2021-08-14 NOTE — ED Provider Notes (Signed)
[facility] DEPT Provider Note   CSN: [id number] Arrival date & time: 08/14/21  4854     History Chief Complaint  Patient presents with   Abdominal Pain    Angela Ramos is a 25 y.o. female.  Patient with no past surgical history presents to the emergency department today for evaluation of abdominal pain.  Symptoms started gradually yesterday and became worse today.  Pain started in the lower abdomen.  It does not radiate.  She has been eating and drinking, last ate around 8 AM.  No chest pain or shortness of breath.  She has had dysuria, suprapubic pain and increased frequency.  No diarrhea or constipation.  No vaginal bleeding or discharge.  She tried to go to work this morning with the pain was worse so she came in for evaluation.  She works at the [employer].  Pain is worse with movement.  No history of ovarian cysts or other problems.       Past Medical History:  Diagnosis Date   Abscess    Asthma    Depression    Panic attacks     Patient Active Problem List   Diagnosis Date Noted   Routine screening for STI (sexually transmitted infection) 06/01/2014   Dysmenorrhea 06/01/2014   Encounter for management and injection of injectable progestin contraceptive 06/01/2014    Past Surgical History:  Procedure Laterality Date   Leg mass removed     MYRINGOTOMY WITH TUBE PLACEMENT     TURBINATE REDUCTION Bilateral 08/30/2017     OB History   No obstetric history on file.     Family History  Problem Relation Age of Onset   Diabetes Maternal Aunt    Hypertension Maternal Aunt    Hypertension Maternal Grandmother     Social History   Tobacco Use   Smoking status: Never   Smokeless tobacco: Never  Vaping Use   Vaping Use: Never used  Substance Use Topics   Alcohol use: No   Drug use: No    Home Medications Prior to Admission medications   Medication Sig Start Date End Date Taking? Authorizing Provider  albuterol  (PROVENTIL HFA;VENTOLIN HFA) 108 (90 BASE) MCG/ACT inhaler Inhale 2 puffs into the lungs every 6 (six) hours as needed for wheezing or shortness of breath. 02/28/15 03/31/15  Glynis Smiles, DO  fluticasone (FLONASE) 50 MCG/ACT nasal spray Place into both nostrils daily.    [provider]  levocetirizine (XYZAL) 5 MG tablet Take 5 mg by mouth every evening.    [provider]  pantoprazole (PROTONIX) 20 MG tablet Take 1 tablet (20 mg total) by mouth daily. 02/20/21 03/22/21  Chase Picket, MD    Allergies    Patient has no known allergies.  Review of Systems   Review of Systems  Constitutional:  Negative for fever.  HENT:  Negative for rhinorrhea and sore throat.   Eyes:  Negative for redness.  Respiratory:  Negative for cough.   Cardiovascular:  Negative for chest pain.  Gastrointestinal:  Positive for abdominal pain. Negative for diarrhea, nausea and vomiting.  Genitourinary:  Positive for dysuria and frequency. Negative for hematuria and urgency.  Musculoskeletal:  Negative for myalgias.  Skin:  Negative for rash.  Neurological:  Negative for headaches.   Physical Exam Updated Vital Signs BP 119/75 (BP Location: Left Arm)   Pulse 96   Temp 98.3 F (36.8 C) (Oral)   Resp 16   Ht 5\' 1"  (1.549 m)  Wt 54 kg   SpO2 100%   BMI 22.48 kg/m   Physical Exam Vitals and nursing note reviewed.  Constitutional:      General: She is not in acute distress.    Appearance: She is well-developed.  HENT:     Head: Normocephalic and atraumatic.     Right Ear: External ear normal.     Left Ear: External ear normal.     Nose: Nose normal.  Eyes:     Conjunctiva/sclera: Conjunctivae normal.  Cardiovascular:     Rate and Rhythm: Normal rate and regular rhythm.     Heart sounds: No murmur heard. Pulmonary:     Effort: No respiratory distress.     Breath sounds: No wheezing, rhonchi or rales.  Abdominal:     Palpations: Abdomen is soft.     Tenderness: There is  abdominal tenderness (Moderate tenderness, patient winces especially with deep palpation) in the right lower quadrant and suprapubic area. There is guarding. There is no rebound. Positive signs include Rovsing's sign and McBurney's sign. Negative signs include Murphy's sign.  Musculoskeletal:     Cervical back: Normal range of motion and neck supple.     Right lower leg: No edema.     Left lower leg: No edema.  Skin:    General: Skin is warm and dry.     Findings: No rash.  Neurological:     General: No focal deficit present.     Mental Status: She is alert. Mental status is at baseline.     Motor: No weakness.  Psychiatric:        Mood and Affect: Mood normal.    ED Results / Procedures / Treatments   Labs (all labs ordered are listed, but only abnormal results are displayed) Labs Reviewed  COMPREHENSIVE METABOLIC PANEL - Abnormal; Notable for the following components:      Result Value   Glucose, Bld 101 (*)    All other components within normal limits  CBC - Abnormal; Notable for the following components:   WBC 12.5 (*)    All other components within normal limits  URINALYSIS, ROUTINE W REFLEX MICROSCOPIC - Abnormal; Notable for the following components:   Glucose, UA 50 (*)    All other components within normal limits  LIPASE, BLOOD  I-STAT BETA HCG BLOOD, ED ([facility], [facility], [facility] ONLY)    EKG None  Radiology No results found.  Procedures Procedures   Medications Ordered in ED Medications  ketorolac (TORADOL) 15 MG/ML injection 15 mg (has no administration in time range)    ED Course  I have reviewed the triage vital signs and the nursing notes.  Pertinent labs & imaging results that were available during my care of the patient were reviewed by me and considered in my medical decision making (see chart for details).  Patient seen and examined.  Work-up ordered in triage significant for normal-appearing urine, white blood cell count of 12.5.  Patient does wince with  palpation in the lower abdomen.  Positive Rovsing sign.  Tenderness seems to be worse in the right lower quadrant around Burney's point.  Cannot rule out appendicitis.  Ruptured ovarian cyst, ovarian cyst, diverticulitis or colitis also on differential.  Discussed proceeding with imaging to rule out appendicitis with patient.  She is in agreement.  Will give Toradol for pain.  Vital signs reviewed and are as follows: BP 119/75 (BP Location: Left Arm)   Pulse 96   Temp 98.3 F (36.8 C) (Oral)  Resp 16   Ht 5\' 1"  (1.549 m)   Wt 54 kg   SpO2 100%   BMI 22.48 kg/m   CT showed normal appendix, complicated right ovarian cyst.  Ultrasound recommended.  Patient and family at bedside, updated on results.  Patient agrees to proceed with ultrasound.  Pending results at shift change.  Signout to [employer] who will follow up on results.  I have provided referral to [facility].  Prescription sent in for naproxen.    MDM Rules/Calculators/A&P                           Patient presents with 1 day of right lower quadrant abdominal pain.  CT with large right ovarian cyst likely causing the patient's pain.  Completion of work-up pending.  Will likely need outpatient follow-up with OB/GYN.  Mild leukocytosis, otherwise labs unremarkable.   Final Clinical Impression(s) / ED Diagnoses Final diagnoses:  Cyst of right ovary  RLQ abdominal pain    Rx / DC Orders ED Discharge Orders          Ordered    naproxen (NAPROSYN) 500 MG tablet  2 times daily        08/14/21 1539             Carlisle Cater, PA-C 08/14/21 1541    Lennice Sites, DO 08/15/21 0730

## 2021-08-14 NOTE — ED Provider Notes (Signed)
I assumed care of patient at shift change from previous team, please see note by them for full H&P. Briefly.  Patient presents today for evaluation of lower abdominal pain that started yesterday and became worse today.  She does have a slight leukocytosis.  CMP is unremarkable.  UA with 50 glucose however otherwise normal.  Pregnancy test is negative with normal lipase.  CT scan abdomen pelvis with contrast has already been obtained.  These results have already been discussed with patient by previous team. At this point remaining is to follow-up on patient's ultrasound.  Pelvic ultrasound results as listed below. I discussed these results with patient.  She had good pain relief with the IV Toradol, and previous team sent prescription for naproxen.  Ultrasound showed ovarian cystic lesions likely suggestive of endometriomas with recommendations for repeat ultrasound or surgical removal. These results were discussed with patient at length.  She is given OB/GYN follow-up.  Return precautions were discussed with patient who states their understanding.  At the time of discharge patient denied any unaddressed complaints or concerns.  Patient is agreeable for discharge home.  Note: Portions of this report may have been transcribed using voice recognition software. Every effort was made to ensure accuracy; however, inadvertent computerized transcription errors may be present   US Transvaginal Non-OB  Result Date: 08/14/2021 CLINICAL DATA:  Large ovarian cyst.  Right lower quadrant pain. EXAM: TRANSABDOMINAL AND TRANSVAGINAL ULTRASOUND OF PELVIS DOPPLER ULTRASOUND OF OVARIES TECHNIQUE: Both transabdominal and transvaginal ultrasound examinations of the pelvis were performed. Transabdominal technique was performed for global imaging of the pelvis including uterus, ovaries, adnexal regions, and pelvic cul-de-sac. It was necessary to proceed with endovaginal exam following the transabdominal exam to  visualize the endometrium and bilateral ovaries/adnexa. Color and duplex Doppler ultrasound was utilized to evaluate blood flow to the ovaries. COMPARISON:  CT abdomen pelvis 08/14/2021 FINDINGS: Uterus Measurements: 6.7 x 2.8 x 4.4 cm = volume: 44 mL. No fibroids or other mass visualized. Endometrium Thickness: 10 mm.  No focal abnormality visualized. Right ovary Measurements: 5.3 x 5.3 x 5.4 cm = volume: 79 mL. There is a 4.9 x 3.5 x 4.5 cm right ovarian cystic lesion with homogeneous low level internal echoes suggestive of an endometrioma. No associated intralesional vascularity. Question several other right ovarian lesions not well visualized on this study. Left ovary Measurements: 3.8 x 2.7 x 3.6 cm = volume: 19 mL. There is a 2.3 x 1.5 x 1.5 cm left ovarian cystic lesion with homogeneous low level internal echoes suggestive an endometrioma. Question several other left ovarian lesions not well visualized on this study. No associated intralesional vascularity. Pulsed Doppler evaluation of both ovaries demonstrates normal low-resistance arterial and venous waveforms. Other findings Trace free fluid. IMPRESSION: 1. A 4.9 cm right and 2.3 cm left ovarian cystic lesions suggestive of endometriomas. Recommend repeat ultrasound in 6-12 weeks. If not surgically removed, recommend follow-up Q 1 year after that. 2. Question several other left and right ovarian lesions not well visualized on this study. Recommend repeat ultrasound in 6-12 weeks. Consider gynecologic consultation Electronically Signed   By: Iven Finn M.D.   On: 08/14/2021 15:27   US Pelvis Complete  Result Date: 08/14/2021 CLINICAL DATA:  Large ovarian cyst.  Right lower quadrant pain. EXAM: TRANSABDOMINAL AND TRANSVAGINAL ULTRASOUND OF PELVIS DOPPLER ULTRASOUND OF OVARIES TECHNIQUE: Both transabdominal and transvaginal ultrasound examinations of the pelvis were performed. Transabdominal technique was performed for global imaging of the pelvis  including uterus, ovaries, adnexal regions, and  pelvic cul-de-sac. It was necessary to proceed with endovaginal exam following the transabdominal exam to visualize the endometrium and bilateral ovaries/adnexa. Color and duplex Doppler ultrasound was utilized to evaluate blood flow to the ovaries. COMPARISON:  CT abdomen pelvis 08/14/2021 FINDINGS: Uterus Measurements: 6.7 x 2.8 x 4.4 cm = volume: 44 mL. No fibroids or other mass visualized. Endometrium Thickness: 10 mm.  No focal abnormality visualized. Right ovary Measurements: 5.3 x 5.3 x 5.4 cm = volume: 79 mL. There is a 4.9 x 3.5 x 4.5 cm right ovarian cystic lesion with homogeneous low level internal echoes suggestive of an endometrioma. No associated intralesional vascularity. Question several other right ovarian lesions not well visualized on this study. Left ovary Measurements: 3.8 x 2.7 x 3.6 cm = volume: 19 mL. There is a 2.3 x 1.5 x 1.5 cm left ovarian cystic lesion with homogeneous low level internal echoes suggestive an endometrioma. Question several other left ovarian lesions not well visualized on this study. No associated intralesional vascularity. Pulsed Doppler evaluation of both ovaries demonstrates normal low-resistance arterial and venous waveforms. Other findings Trace free fluid. IMPRESSION: 1. A 4.9 cm right and 2.3 cm left ovarian cystic lesions suggestive of endometriomas. Recommend repeat ultrasound in 6-12 weeks. If not surgically removed, recommend follow-up Q 1 year after that. 2. Question several other left and right ovarian lesions not well visualized on this study. Recommend repeat ultrasound in 6-12 weeks. Consider gynecologic consultation Electronically Signed   By: Iven Finn M.D.   On: 08/14/2021 15:27   CT ABDOMEN PELVIS W CONTRAST  Result Date: 08/14/2021 CLINICAL DATA:  Right lower quadrant abdominal pain EXAM: CT ABDOMEN AND PELVIS WITH CONTRAST TECHNIQUE: Multidetector CT imaging of the abdomen and pelvis was  performed using the standard protocol following bolus administration of intravenous contrast. CONTRAST:  46mL OMNIPAQUE IOHEXOL 350 MG/ML SOLN COMPARISON:  None. FINDINGS: Lower chest: The lung bases are clear. The imaged heart is unremarkable. Hepatobiliary: The liver and gallbladder are unremarkable. There is no biliary ductal dilatation. Pancreas: Unremarkable. Spleen: Unremarkable. Adrenals/Urinary Tract: There is a 1.3 cm left adrenal nodule which measures less than 10 Hounsfield units, consistent with an adenoma. The right adrenal is unremarkable. The kidneys are unremarkable, with no focal lesion, stone, hydronephrosis, or hydroureter. The bladder is unremarkable Stomach/Bowel: The stomach is unremarkable. There is no evidence of bowel obstruction. There is no abnormal bowel wall thickening or inflammatory change. The appendix is unremarkable (2-46). Vascular/Lymphatic: The abdominal aorta is normal in course and caliber. The major branch vessels are patent. The main portal and splenic veins are patent. There is no abdominal or pelvic lymphadenopathy. Reproductive: The uterus is within normal limits for age. There are multiple follicles in the left ovary. There is a dominant septated right ovarian cyst measuring up to 4.3 cm x 3.5 cm. Other: There is no ascites or free air. Musculoskeletal: There is slight dextroscoliosis of the spine centered at T12. There is no acute osseous abnormality or aggressive osseous lesion. IMPRESSION: 1. Septated right ovarian cyst measuring up to 4.3 cm. Given the history of right lower quadrant pain, pelvic ultrasound is suggested for further evaluation. 2. Otherwise, no acute findings in the abdomen or pelvis. Specifically, no evidence of appendicitis. 3. Small left adrenal adenoma. Electronically Signed   By: Valetta Mole M.D.   On: 08/14/2021 13:54   [place] Art/Ven Flow Abd Pelv Doppler  Result Date: 08/14/2021 CLINICAL DATA:  Large ovarian cyst.  Right lower quadrant pain.  EXAM: TRANSABDOMINAL AND TRANSVAGINAL  ULTRASOUND OF PELVIS DOPPLER ULTRASOUND OF OVARIES TECHNIQUE: Both transabdominal and transvaginal ultrasound examinations of the pelvis were performed. Transabdominal technique was performed for global imaging of the pelvis including uterus, ovaries, adnexal regions, and pelvic cul-de-sac. It was necessary to proceed with endovaginal exam following the transabdominal exam to visualize the endometrium and bilateral ovaries/adnexa. Color and duplex Doppler ultrasound was utilized to evaluate blood flow to the ovaries. COMPARISON:  CT abdomen pelvis 08/14/2021 FINDINGS: Uterus Measurements: 6.7 x 2.8 x 4.4 cm = volume: 44 mL. No fibroids or other mass visualized. Endometrium Thickness: 10 mm.  No focal abnormality visualized. Right ovary Measurements: 5.3 x 5.3 x 5.4 cm = volume: 79 mL. There is a 4.9 x 3.5 x 4.5 cm right ovarian cystic lesion with homogeneous low level internal echoes suggestive of an endometrioma. No associated intralesional vascularity. Question several other right ovarian lesions not well visualized on this study. Left ovary Measurements: 3.8 x 2.7 x 3.6 cm = volume: 19 mL. There is a 2.3 x 1.5 x 1.5 cm left ovarian cystic lesion with homogeneous low level internal echoes suggestive an endometrioma. Question several other left ovarian lesions not well visualized on this study. No associated intralesional vascularity. Pulsed Doppler evaluation of both ovaries demonstrates normal low-resistance arterial and venous waveforms. Other findings Trace free fluid. IMPRESSION: 1. A 4.9 cm right and 2.3 cm left ovarian cystic lesions suggestive of endometriomas. Recommend repeat ultrasound in 6-12 weeks. If not surgically removed, recommend follow-up Q 1 year after that. 2. Question several other left and right ovarian lesions not well visualized on this study. Recommend repeat ultrasound in 6-12 weeks. Consider gynecologic consultation Electronically Signed   By:  Iven Finn M.D.   On: 08/14/2021 15:27          Lorin Glass, PA-C 08/15/21 Irven Coe    Gareth Morgan, MD 08/17/21 Benancio Deeds

## 2021-11-29 ENCOUNTER — Other Ambulatory Visit: Payer: Self-pay

## 2021-11-29 ENCOUNTER — Ambulatory Visit (HOSPITAL_COMMUNITY)
Admission: EM | Admit: 2021-11-29 | Discharge: 2021-11-29 | Disposition: A | Discharge status: Home / Self Care | Payer: 59 | Attending: Emergency Medicine | Admitting: Emergency Medicine

## 2021-11-29 ENCOUNTER — Encounter (HOSPITAL_COMMUNITY): Payer: Self-pay

## 2021-11-29 DIAGNOSIS — N80129 Deep endometriosis of ovary, unspecified ovary: Secondary | ICD-10-CM | POA: Diagnosis not present

## 2021-11-29 DIAGNOSIS — R102 Pelvic and perineal pain: Secondary | ICD-10-CM

## 2021-11-29 LAB — POC URINE PREG, ED: Preg Test, Ur: NEGATIVE

## 2021-11-29 LAB — POCT URINALYSIS DIPSTICK, ED / UC
Bilirubin Urine: NEGATIVE
Glucose, UA: 500 mg/dL — AB
Hgb urine dipstick: NEGATIVE
Ketones, ur: NEGATIVE mg/dL
Leukocytes,Ua: NEGATIVE
Nitrite: NEGATIVE
Protein, ur: NEGATIVE mg/dL
Specific Gravity, Urine: <=1.005 (ref 1.005–1.030)
Urobilinogen, UA: 0.2 mg/dL (ref 0.0–1.0)
pH: 6 (ref 5.0–8.0)

## 2021-11-29 LAB — CBG MONITORING, ED: Glucose-Capillary: 80 mg/dL (ref 70–99)

## 2021-11-29 NOTE — ED Provider Notes (Signed)
?Angela Ramos ? ? ?MRN: [id number] DOB: 1996/08/26 ? ?Subjective:  ? ?Chief Complaint;  ?Chief Complaint  ?Patient presents with  ? Pelvic Pain  ? ? ?Angela Ramos is a 26 y.o. female presenting for pelvic pain for the past 2 days.  Patient has history of endometriomas and has been regularly followed by GYN.  Last ultrasound was in November 2022.  Patient states her last menstrual period was approximately 2 weeks ago, her periods are regular and not very heavy.  She usually has urinary frequency and pelvic pain but states both have worsened in severity over the last 2 days.  She states the pain seems to get worse after she has been physically active but then usually resolves on its own or after taking Motrin 600 mg.  She denies associated vaginal discharge, fever, nausea or vomiting or change in the location of her pain.  She is not taking birth control and is sexually active. ? ?No current facility-administered medications for this encounter. ? ?Current Outpatient Medications:  ?  albuterol (PROVENTIL HFA;VENTOLIN HFA) 108 (90 BASE) MCG/ACT inhaler, Inhale 2 puffs into the lungs every 6 (six) hours as needed for wheezing or shortness of breath., Disp: 1 Inhaler, Rfl: 0 ?  fluticasone (FLONASE) 50 MCG/ACT nasal spray, Place into both nostrils daily., Disp: , Rfl:  ?  levocetirizine (XYZAL) 5 MG tablet, Take 5 mg by mouth every evening., Disp: , Rfl:  ?  naproxen (NAPROSYN) 500 MG tablet, Take 1 tablet (500 mg total) by mouth 2 (two) times daily., Disp: 20 tablet, Rfl: 0 ?  pantoprazole (PROTONIX) 20 MG tablet, Take 1 tablet (20 mg total) by mouth daily., Disp: 30 tablet, Rfl: 0  ? ?No Known Allergies ? ?Past Medical History:  ?Diagnosis Date  ? Abscess   ? Asthma   ? Depression   ? Panic attacks   ?  ? ?Review of Systems  ?All other systems reviewed and are negative. ? ? ?Objective:  ? ?Vitals: ?BP 120/78 (BP Location: Left Arm)   Pulse 95   Temp 98.5 ?F (36.9 ?C) (Oral)   Resp 18    SpO2 100%  ? ?Physical Exam ?Vitals and nursing note reviewed.  ?Constitutional:   ?   General: She is not in acute distress. ?   Appearance: She is well-developed.  ?HENT:  ?   Head: Normocephalic and atraumatic.  ?Eyes:  ?   Conjunctiva/sclera: Conjunctivae normal.  ?Cardiovascular:  ?   Rate and Rhythm: Normal rate and regular rhythm.  ?   Heart sounds: No murmur heard. ?Pulmonary:  ?   Effort: Pulmonary effort is normal. No respiratory distress.  ?   Breath sounds: Normal breath sounds.  ?Abdominal:  ?   Palpations: Abdomen is soft. There is mass (Positive fullness right lower quadrant consistent with patient's 5 cm endometrioma.  Tenderness is direct diffuse to the lower pelvis without rebound tenderness or distention).  ?   Tenderness: There is no abdominal tenderness.  ?Musculoskeletal:     ?   General: No swelling.  ?   Cervical back: Neck supple.  ?Skin: ?   General: Skin is warm and dry.  ?   Capillary Refill: Capillary refill takes less than 2 seconds.  ?Neurological:  ?   Mental Status: She is alert.  ?Psychiatric:     ?   Mood and Affect: Mood normal.  ? ? ?Results for orders placed or performed during the hospital encounter of 11/29/21 (from the past  24 hour(s))  ?POC Urinalysis dipstick     Status: Abnormal  ? Collection Time: 11/29/21 11:13 AM  ?Result Value Ref Range  ? Glucose, UA 500 (A) NEGATIVE mg/dL  ? Bilirubin Urine NEGATIVE NEGATIVE  ? Ketones, ur NEGATIVE NEGATIVE mg/dL  ? Specific Gravity, Urine <=1.005 1.005 - 1.030  ? Hgb urine dipstick NEGATIVE NEGATIVE  ? pH 6.0 5.0 - 8.0  ? Protein, ur NEGATIVE NEGATIVE mg/dL  ? Urobilinogen, UA 0.2 0.0 - 1.0 mg/dL  ? Nitrite NEGATIVE NEGATIVE  ? Leukocytes,Ua NEGATIVE NEGATIVE  ?POC CBG monitoring     Status: None  ? Collection Time: 11/29/21 11:45 AM  ?Result Value Ref Range  ? Glucose-Capillary 80 70 - 99 mg/dL  ?POC urine pregnancy     Status: None  ? Collection Time: 11/29/21 11:48 AM  ?Result Value Ref Range  ? Preg Test, Ur NEGATIVE NEGATIVE   ? ? ?No results found.  ? ? ?Assessment and Plan :  ? ?1. Endometrioma of ovary   ?2. Pelvic pain in female   ? ? ?No orders of the defined types were placed in this encounter. ? ? ?MDM:  ?Angela Ramos is a 26 y.o. female with history of multiple endometriomas presenting for lower pelvic pain without fever nausea or vomiting.  Exam is consistent with direct but no rebound tenderness in the lower pelvis.  Patient has follow-up with her GYN next week and is taking Motrin 600 I suggested she add 2 extra strength Tylenol to take with the Motrin every 6 hours as needed for pain.  It is likely that her endometriomas are getting larger.  Patient was also noted to have glucose in her urine at 500 without evidence of infection. Pregnancy was negative. Blood glucose was 80.  I discussed treatment, follow up and return instructions. Questions were answered. Patient stated understanding of instructions and is stable for discharge. ? ?Angela Lauth FNP-C MCN  ?  ?Hezzie Bump, NP ?11/29/21 1157 ? ?

## 2021-11-29 NOTE — Discharge Instructions (Addendum)
There was no evidence of a urinary tract infection today.  Take Motrin 600 mg along with 2 extra strength Tylenol every 6 hours as needed for pain.  Keep your follow-up appointment next week with your GYN.  It is possible your endometriomas are increasing in size which is causing your pain. return for new or worsening symptoms at any time for reevaluation ?

## 2021-11-29 NOTE — ED Triage Notes (Signed)
Pt presents for pelvic pain x 4 days.  ?She reports dysuria. ?

## 2022-01-23 ENCOUNTER — Other Ambulatory Visit: Payer: Self-pay

## 2022-01-23 ENCOUNTER — Ambulatory Visit (HOSPITAL_COMMUNITY)
Admission: EM | Admit: 2022-01-23 | Discharge: 2022-01-23 | Disposition: A | Discharge status: Home / Self Care | Payer: 59 | Attending: Internal Medicine | Admitting: Internal Medicine

## 2022-01-23 ENCOUNTER — Encounter (HOSPITAL_COMMUNITY): Payer: Self-pay | Admitting: Emergency Medicine

## 2022-01-23 DIAGNOSIS — R519 Headache, unspecified: Secondary | ICD-10-CM | POA: Diagnosis not present

## 2022-01-23 LAB — POC URINE PREG, ED: Preg Test, Ur: NEGATIVE

## 2022-01-23 MED ORDER — KETOROLAC TROMETHAMINE 30 MG/ML IJ SOLN
INTRAMUSCULAR | Status: AC
  Filled 2022-01-23: qty 1

## 2022-01-23 MED ORDER — ONDANSETRON 4 MG PO TBDP: 4.0000 mg | ORAL_TABLET | Freq: Three times a day (TID) | ORAL | 0 refills | Status: DC | PRN

## 2022-01-23 MED ORDER — KETOROLAC TROMETHAMINE 30 MG/ML IJ SOLN
30.0000 mg | Freq: Once | INTRAMUSCULAR | Status: AC
  Administered 2022-01-23: 30 mg via INTRAMUSCULAR

## 2022-01-23 MED ORDER — NAPROXEN 500 MG PO TABS: 500.0000 mg | ORAL_TABLET | Freq: Two times a day (BID) | ORAL | 0 refills | Status: DC

## 2022-01-23 MED ORDER — KETOROLAC TROMETHAMINE 60 MG/2ML IM SOLN: 60.0000 mg | Freq: Once | INTRAMUSCULAR | Status: DC

## 2022-01-23 NOTE — Discharge Instructions (Signed)
You were seen today and treated for your headache.  Your pregnancy test was negative, so I was able to give you an injection of a medication called Toradol.  This Toradol should work in your body for the next 6 to 8 hours.  Please do not take any NSAID medications such as ibuprofen, Aleve, naproxen, Advil, and aspirin for the next 6 to 8 hours.   ? ?I have prescribed naproxen for you to take at home for your head pain.  You may take 500 mg twice daily as needed for your headache.  Do not take naproxen until tomorrow morning with breakfast since you are given the NSAID medication as discussed above in the clinic today. ? ?Please follow-up with your primary care provider regarding your combination oral contraceptive pills (birth control).  I am not going to take you off of your birth control at this time, but I would like someone to investigate this and be sure that it is the best choice for you at this time due to your potential migraines with auras. ? ?Please return for any new or worsening symptoms.  Please also go to the emergency room if you experience severe symptoms.  Follow-up with your primary care provider as discussed.  You feel better! ?

## 2022-01-23 NOTE — ED Triage Notes (Signed)
Pt reports intermittent migraines since Saturday. States right before the migraine she experiences dizziness and blurred vision.  ?

## 2022-01-23 NOTE — ED Provider Notes (Signed)
?[name] ? ? ? ?CSN: [id number] ?Arrival date & time: 01/23/22  1534 ? ? ?  ? ?History   ?Chief Complaint ?Chief Complaint  ?Patient presents with  ? Migraine  ? ? ?HPI ?Angela Ramos is a 26 y.o. female.  ? ?Patient presents to urgent care for evaluation of her migraine that she has had intermittently since Saturday. States this is the first time that she has had a migraine for more than one day. States she used to have migraines in middle school, then they subsided for years and this is the first time she has had a migraine since then. Does not take migraine prevention medication at home. Reports dizziness and blurred vision 10 minutes prior to onset of migraine. Also reports nausea without vomiting. She has taken motrin, sudafed, and flonase medication without relief. Last dose of motrin was last night and she took '400mg'$ . Reports photophobia and lightheadedness. Currently rates her head pain at a 4 on a scale of 0-10 and reports increased pain with forward flexion at the hips. States her pain is mostly to her forehead and it wraps around her head like a tension headache would. Denies any other aggravating or relieving factors.  ? ?Patient also reports taking birth control pills and asks if there is a chance that she could be pregnant while taking birth control.  She states that a couple of weeks ago she had a "mishap" and is wondering if her symptoms could be signs of pregnancy.  She takes a combination oral contraceptive pill that was prescribed by her OB/GYN due to her endometriosis.  She denies vaginal discharge, odor, and bleeding.  ? ? ?Migraine ? ? ?Past Medical History:  ?Diagnosis Date  ? Abscess   ? Asthma   ? Depression   ? Panic attacks   ? ? ?Patient Active Problem List  ? Diagnosis Date Noted  ? Routine screening for STI (sexually transmitted infection) 06/01/2014  ? Dysmenorrhea 06/01/2014  ? Encounter for management and injection of injectable progestin contraceptive  06/01/2014  ? ? ?Past Surgical History:  ?Procedure Laterality Date  ? Leg mass removed    ? MYRINGOTOMY WITH TUBE PLACEMENT    ? TURBINATE REDUCTION Bilateral 08/30/2017  ? ? ?OB History   ?No obstetric history on file. ?  ? ? ? ?Home Medications   ? ?Prior to Admission medications   ?Medication Sig Start Date End Date Taking? Authorizing Provider  ?naproxen (NAPROSYN) 500 MG tablet Take 1 tablet (500 mg total) by mouth 2 (two) times daily. 01/23/22  Yes Talbot Grumbling, FNP  ?albuterol (PROVENTIL HFA;VENTOLIN HFA) 108 (90 BASE) MCG/ACT inhaler Inhale 2 puffs into the lungs every 6 (six) hours as needed for wheezing or shortness of breath. 02/28/15 03/31/15  Glynis Smiles, DO  ?fluticasone (FLONASE) 50 MCG/ACT nasal spray Place into both nostrils daily.    [provider]  ?levocetirizine (XYZAL) 5 MG tablet Take 5 mg by mouth every evening.    [provider]  ?pantoprazole (PROTONIX) 20 MG tablet Take 1 tablet (20 mg total) by mouth daily. 02/20/21 03/22/21  Chase Picket, MD  ? ? ?Family History ?Family History  ?Problem Relation Age of Onset  ? Hypertension Maternal Grandmother   ? Diabetes Maternal Aunt   ? Hypertension Maternal Aunt   ? ? ?Social History ?Social History  ? ?Tobacco Use  ? Smoking status: Never  ? Smokeless tobacco: Never  ?Vaping Use  ? Vaping Use: Never used  ?Substance  Use Topics  ? Alcohol use: No  ? Drug use: No  ? ? ? ?Allergies   ?Patient has no known allergies. ? ? ?Review of Systems ?Review of Systems ?Per HPI ? ?Physical Exam ?Triage Vital Signs ?ED Triage Vitals  ?Enc Vitals Group  ?   BP 01/23/22 1608 119/73  ?   Pulse Rate 01/23/22 1608 85  ?   Resp 01/23/22 1608 17  ?   Temp 01/23/22 1608 98.6 ?F (37 ?C)  ?   Temp Source 01/23/22 1608 Oral  ?   SpO2 01/23/22 1608 100 %  ?   Weight 01/23/22 1607 119 lb 0.8 oz (54 kg)  ?   Height 01/23/22 1607 '5\' 1"'$  (1.549 m)  ?   Head Circumference --   ?   Peak Flow --   ?   Pain Score 01/23/22 1606 6  ?   Pain Loc --   ?    Pain Edu? --   ?   Excl. in [place]? --   ? ?No data found. ? ?Updated Vital Signs ?BP 119/73 (BP Location: Left Arm)   Pulse 85   Temp 98.6 ?F (37 ?C) (Oral)   Resp 17   Ht '5\' 1"'$  (1.549 m)   Wt 119 lb 0.8 oz (54 kg)   SpO2 100%   BMI 22.49 kg/m?  ? ?Visual Acuity ?Right Eye Distance:   ?Left Eye Distance:   ?Bilateral Distance:   ? ?Right Eye Near:   ?Left Eye Near:    ?Bilateral Near:    ? ?Physical Exam ?Vitals and nursing note reviewed.  ?Constitutional:   ?   General: She is not in acute distress. ?   Appearance: Normal appearance. She is well-developed. She is not ill-appearing.  ?HENT:  ?   Head: Normocephalic and atraumatic.  ?   Right Ear: External ear normal. There is no impacted cerumen.  ?   Left Ear: Tympanic membrane, ear canal and external ear normal. There is no impacted cerumen.  ?   Ears:  ?   Comments: Unable to visualize TM due to tube in patient's ear.  Patient states that this tube was inserted a few years ago and is being monitored by her ear nose and throat doctor.  There is no erythema or drainage surrounding tube in patient's ear.  No sign of infection at this time.  Patient has normal hearing to right ear.  Minimal cerumen noted on exam to right ear that is not obstructing tube. ?   Nose: Nose normal.  ?   Mouth/Throat:  ?   Mouth: Mucous membranes are moist.  ?Eyes:  ?   Extraocular Movements: Extraocular movements intact.  ?   Conjunctiva/sclera: Conjunctivae normal.  ?Cardiovascular:  ?   Rate and Rhythm: Normal rate and regular rhythm.  ?   Heart sounds: Normal heart sounds. No murmur heard. ?  No friction rub. No gallop.  ?Pulmonary:  ?   Effort: Pulmonary effort is normal. No respiratory distress.  ?   Breath sounds: Normal breath sounds. No wheezing, rhonchi or rales.  ?Chest:  ?   Chest wall: No tenderness.  ?Abdominal:  ?   Palpations: Abdomen is soft.  ?   Tenderness: There is no abdominal tenderness. There is no right CVA tenderness or left CVA tenderness.  ?Musculoskeletal:      ?   General: No swelling or tenderness. Normal range of motion.  ?   Cervical back: Normal range of motion and neck  supple. No rigidity or tenderness.  ?   Right lower leg: No edema.  ?   Left lower leg: No edema.  ?Skin: ?   General: Skin is warm and dry.  ?   Capillary Refill: Capillary refill takes less than 2 seconds.  ?   Findings: No rash.  ?Neurological:  ?   General: No focal deficit present.  ?   Mental Status: She is alert and oriented to person, place, and time. Mental status is at baseline.  ?   Cranial Nerves: Cranial nerves 2-12 are intact.  ?   Sensory: Sensation is intact. No sensory deficit.  ?   Motor: Motor function is intact. No weakness.  ?   Coordination: Coordination is intact.  ?   Gait: Gait is intact. Gait normal.  ?   Comments: Benign neuro exam patient is neurologically intact.  ?Psychiatric:     ?   Mood and Affect: Mood normal.     ?   Behavior: Behavior normal.     ?   Thought Content: Thought content normal.     ?   Judgment: Judgment normal.  ? ? ? ?UC Treatments / Results  ?Labs ?(all labs ordered are listed, but only abnormal results are displayed) ?Labs Reviewed  ?POC URINE PREG, ED  ? ? ?EKG ? ? ?Radiology ?No results found. ? ?Procedures ?Procedures (including critical care time) ? ?Medications Ordered in UC ?Medications  ?ketorolac (TORADOL) 30 MG/ML injection 30 mg (30 mg Intramuscular Given 01/23/22 1727)  ? ? ?Initial Impression / Assessment and Plan / UC Course  ?I have reviewed the triage vital signs and the nursing notes. ? ?Pertinent labs & imaging results that were available during my care of the patient were reviewed by me and considered in my medical decision making (see chart for details). ? ?Patient presents with a 4 day history of intermittent headache, photophobia, and nausea. Suspect patient's headache is either an atypical migraine with aura or a tension type headache. Doubt acute intracranial process at this time requiring emergent evaluation.  Patient  neurological exam is benign at this time.  Instructed to follow-up with her primary care provider and/or OB/GYN since combination oral contraceptive pills are not typically indicated for patients with migraine with aura.

## 2022-05-18 DIAGNOSIS — N809 Endometriosis, unspecified: Secondary | ICD-10-CM | POA: Insufficient documentation

## 2022-09-25 ENCOUNTER — Encounter: Payer: Self-pay | Admitting: Family

## 2022-09-25 ENCOUNTER — Ambulatory Visit (INDEPENDENT_AMBULATORY_CARE_PROVIDER_SITE_OTHER): Payer: 59 | Admitting: Family

## 2022-09-25 VITALS — BP 114/75 | HR 94 | Temp 97.4°F | Ht 61.0 in | Wt 134.5 lb

## 2022-09-25 DIAGNOSIS — G8929 Other chronic pain: Secondary | ICD-10-CM | POA: Insufficient documentation

## 2022-09-25 DIAGNOSIS — M25552 Pain in left hip: Secondary | ICD-10-CM | POA: Diagnosis not present

## 2022-09-25 DIAGNOSIS — N809 Endometriosis, unspecified: Secondary | ICD-10-CM

## 2022-09-25 DIAGNOSIS — F411 Generalized anxiety disorder: Secondary | ICD-10-CM | POA: Diagnosis not present

## 2022-09-25 DIAGNOSIS — J4520 Mild intermittent asthma, uncomplicated: Secondary | ICD-10-CM

## 2022-09-25 MED ORDER — ALBUTEROL SULFATE HFA 108 (90 BASE) MCG/ACT IN AERS: 2.0000 | INHALATION_SPRAY | Freq: Four times a day (QID) | RESPIRATORY_TRACT | 0 refills | Status: DC | PRN

## 2022-09-25 MED ORDER — DICLOFENAC SODIUM 1 % EX GEL: 4.0000 g | Freq: Four times a day (QID) | CUTANEOUS | 2 refills | Status: AC | PRN

## 2022-09-25 NOTE — Patient Instructions (Addendum)
Welcome to [facility], It was a pleasure meeting you today!  Switch your Celexa to bedtime and let me know how this is doing to not cause drowsiness during the day.  Start an exercise routine with around 20-65mn of cardio exercise during the day or right after work to keep you from been tired during day.  Limit your sleep to no more than 9 hours to keep from feeling too tired during the day.  Eat a whole foods diet, less carbs and sweets as these can cause drowsiness as well if eaten in excess.  Schedule a one month follow up visit if needed for your Anxiety.    PLEASE NOTE: If you had any LAB tests please let [facility] if you have not heard back within a few days. You may see your results on MyChart before we have a chance to review them but we will give you a call once they are reviewed by [facility] If we ordered any REFERRALS today, please let [facility] if you have not heard from their office within the next week.  Let [facility] through MyChart if you are needing REFILLS, or have your pharmacy send [facility] request. You can also use MyChart to communicate with me or any office staff.  Please try these tips to maintain a healthy lifestyle: It is important that you exercise regularly at least 30 minutes 5 times a week. Think about what you will eat, plan ahead. Choose whole foods, & think  "clean, green, fresh or frozen" over canned, processed or packaged foods which are more sugary, salty, and fatty. 70 to 75% of food eaten should be fresh vegetables and protein. 2-3  meals daily with healthy snacks between meals, but must be whole fruit, protein or vegetables. Aim to eat over a 10 hour period when you are active, for example, 7am to 5pm, and then STOP after your last meal of the day, drinking only water.  Shorter eating windows, 6-8 hours, are showing benefits in heart disease and blood sugar regulation. Drink water every day! Shoot for 64 ounces daily = 8 cups, no  other drink is as healthy! Fruit juice is best enjoyed in a healthy way, by EATING the fruit.

## 2022-09-25 NOTE — Progress Notes (Signed)
New Patient Office Visit  Subjective:  Patient ID: Angela Ramos, female    DOB: 12/13/1995  Age: 26 y.o. MRN: [id number]  CC:  Chief Complaint  Patient presents with  . New Patient (Initial Visit)  . Anxiety    Pt is currently taking Celexa, Has been on medication for 4 months. Usually takes it in the morning around 9 am but pr states it makes her feel sleepy.   . Endometriosis    Pt states she is taking Birth control for this but would like to stop due to it causing her Anxiety.   . Hip Pain    Pt c/o left hip pain, Present for a month and a half. Currently using diclofenac gel and would like a refill.     HPI Angela Ramos presents for establishing care today.  Anxiety:  sx started in 2020.  But did not seek tx until this year d/t no ins and trying to handle sx on her own.  Found a provider and started on Celexa which has helped her sx but causes her drowsiness. Reports taking Zoloft in past and it also caused drowsiness (took at bedtime).  Endometriosis:  dx last year and started on OCPs. She had been having bad cramping and bleeding. She tried 2 meds previously but caused anxiety, her current pill is doing better, pt reports she is established with a GYN for this.  Left hip pain:    Assessment & Plan:   Problem List Items Addressed This Visit   None   Subjective:    Outpatient Medications Prior to Visit  Medication Sig Dispense Refill  . albuterol (PROVENTIL HFA;VENTOLIN HFA) 108 (90 BASE) MCG/ACT inhaler Inhale 2 puffs into the lungs every 6 (six) hours as needed for wheezing or shortness of breath. 1 Inhaler 0  . citalopram (CELEXA) 10 MG tablet Take 10 mg by mouth daily.    . diclofenac Sodium (VOLTAREN) 1 % GEL Apply 4 g topically as needed (as needed).    [name] KURVELO 0.15-30 MG-MCG tablet Take 1 tablet by mouth daily.    . fluticasone (FLONASE) 50 MCG/ACT nasal spray Place into both nostrils daily.    [name] levocetirizine (XYZAL) 5 MG tablet Take 5 mg  by mouth every evening.    . naproxen (NAPROSYN) 500 MG tablet Take 1 tablet (500 mg total) by mouth 2 (two) times daily. 30 tablet 0  . ondansetron (ZOFRAN-ODT) 4 MG disintegrating tablet Take 1 tablet (4 mg total) by mouth every 8 (eight) hours as needed for nausea or vomiting. 20 tablet 0  . pantoprazole (PROTONIX) 20 MG tablet Take 1 tablet (20 mg total) by mouth daily. 30 tablet 0   No facility-administered medications prior to visit.   Past Medical History:  Diagnosis Date  . Abscess   . Asthma   . Depression   . Endometriosis   . GERD (gastroesophageal reflux disease)   . Panic attacks    Past Surgical History:  Procedure Laterality Date  . Leg mass removed    . MYRINGOTOMY WITH TUBE PLACEMENT    . TURBINATE REDUCTION Bilateral 08/30/2017    Objective:   Today's Vitals: BP 114/75 (BP Location: Left Arm, Patient Position: Sitting, Cuff Size: Large)   Pulse 94   Temp (!) 97.4 F (36.3 C) (Temporal)   Ht '5\' 1"'$  (1.549 m)   Wt 134 lb 8 oz (61 kg)   LMP  (LMP Unknown)   SpO2 100%   BMI 25.41 kg/m  Physical Exam Vitals and nursing note reviewed.  Constitutional:      Appearance: Normal appearance.  Cardiovascular:     Rate and Rhythm: Normal rate and regular rhythm.  Pulmonary:     Effort: Pulmonary effort is normal.     Breath sounds: Normal breath sounds.  Musculoskeletal:        General: Normal range of motion.  Skin:    General: Skin is warm and dry.  Neurological:     Mental Status: She is alert.  Psychiatric:        Mood and Affect: Mood normal.        Behavior: Behavior normal.    No orders of the defined types were placed in this encounter.   Jeanie Sewer, NP

## 2022-09-26 NOTE — Assessment & Plan Note (Signed)
chronic followed by GYN failed 2 OCPs, current one seems to be working f/u prn

## 2022-09-26 NOTE — Assessment & Plan Note (Signed)
chronic has taken Zoloft in past, caused drowsiness  currently taking Celexa which is causing drowsiness but has helped her anxiety advised to take qhs and see if better tolerated, no refill needed today f/u in 3 mos, unless needing to switch meds

## 2022-10-02 ENCOUNTER — Encounter: Payer: Self-pay | Admitting: Family

## 2022-10-02 DIAGNOSIS — K219 Gastro-esophageal reflux disease without esophagitis: Secondary | ICD-10-CM

## 2022-10-05 MED ORDER — PANTOPRAZOLE SODIUM 20 MG PO TBEC: 20.0000 mg | DELAYED_RELEASE_TABLET | Freq: Every day | ORAL | 5 refills | Status: DC

## 2023-01-16 ENCOUNTER — Telehealth: Payer: Self-pay | Admitting: Family

## 2023-01-16 NOTE — Telephone Encounter (Signed)
FYI: This call has been transferred to Access Nurse. Once the result note has been entered staff can address the message at that time.  Patient called in with the following symptoms:  Red Word: Rectal bleeding, diarrhea, abdominal pain, and nausea.    Please advise at Mobile [phone] (mobile)  Message is routed to Provider Pool and [facility] Triage

## 2023-01-16 NOTE — Telephone Encounter (Signed)
Final disposition: See PCP within 3 days. Pt has been scheduled for 4/18 @ 8:40 am w/ [facility].  Patient Name: Angela Ramos Gender: Female DOB: [birth year] Age: 27 Y 7 M 10 D Return Phone Number: [phone] (Primary) Address: City/ State/ Zip: [place]  [address] Client [facility] - Administrator, [facility] at [facility] Contact Type Call Who Is Calling Patient / Member / Family / Caregiver Call Type Triage / Clinical Relationship To Patient Self Return Phone Number [phone] (Primary) Chief Complaint SEVERE ABDOMINAL PAIN - Severe pain in abdomen Reason for Call Symptomatic / Request for Health Information Initial Comment Caller states she has diarrhea and stomach pain. She also states she has blood coming from her rectum. She currently has severe abdomen pain when she is urinating and nausea as well. NP Dulce Sellar is not listed. Translation No Nurse Assessment Nurse: Clarita Leber, RN, Deborah Date/Time (Eastern Time): 01/16/2023 1:02:46 PM Confirm and document reason for call. If symptomatic, describe symptoms. ---The caller states that she is having abdominal pain and diarrhea with rectal bleeding. Hx with irritable bowel 2 years ago. Does the patient have any new or worsening symptoms? ---Yes Will a triage be completed? ---Yes Related visit to physician within the last 2 weeks? ---No Does the PT have any chronic conditions? (i.e. diabetes, asthma, this includes High risk factors for pregnancy, etc.) ---Yes List chronic conditions. ---IBS, endometriosis Is the patient pregnant or possibly pregnant? (Ask all females between the ages of 50-55) ---No Is this a behavioral health or substance abuse call? ---No Guidelines Guideline Title Affirmed Question Affirmed Notes Nurse Date/Time (Eastern Time) Rectal Bleeding MILD rectal bleeding (more than just a few drops or streaks) Womble,  RN, Gavin Pound 01/16/2023 1:04:29 PM PLEASE NOTE: All timestamps contained within this report are represented as [place] Standard Time. CONFIDENTIALTY NOTICE: This fax transmission is intended only for the addressee. It contains information that is legally privileged, confidential or otherwise protected from use or disclosure. If you are not the intended recipient, you are strictly prohibited from reviewing, disclosing, copying using or disseminating any of this information or taking any action in reliance on or regarding this information. If you have received this fax in error, please notify us immediately by telephone so that we can arrange for its return to [place]. Phone: [phone], Toll-Free: [phone], Fax: [phone] Page: 2 of 2 Call Id: [id number] Disp. Time Lamount Cohen Time) Disposition Final User 01/16/2023 12:59:49 PM Send to Urgent Felisa Bonier 01/16/2023 1:13:18 PM SEE PCP WITHIN 3 DAYS Yes Clarita Leber, RN, Deborah Final Disposition 01/16/2023 1:13:18 PM SEE PCP WITHIN 3 DAYS Yes Womble, RN, Jetty Duhamel Disagree/Comply Comply Caller Understands Yes PreDisposition Call Doctor Care Advice Given Per Guideline SEE PCP WITHIN 3 DAYS: * Bleeding increases * You become worse After Care Instructions Given Call Event Type User Date / Time Description Education document email Patrecia Pace 01/16/2023 1:12:54 PM Diarrhea Education document email Clarita Leber, RN, Gavin Pound 01/16/2023 1:12:55 PM Rectal Bleeding Referrals REFERRED TO PCP OFFICE

## 2023-01-17 ENCOUNTER — Encounter: Payer: Self-pay | Admitting: Family

## 2023-01-17 ENCOUNTER — Ambulatory Visit (INDEPENDENT_AMBULATORY_CARE_PROVIDER_SITE_OTHER): Payer: 59 | Admitting: Family

## 2023-01-17 VITALS — BP 104/74 | HR 87 | Temp 97.7°F | Ht 61.0 in | Wt 139.0 lb

## 2023-01-17 DIAGNOSIS — K529 Noninfective gastroenteritis and colitis, unspecified: Secondary | ICD-10-CM | POA: Diagnosis not present

## 2023-01-17 DIAGNOSIS — Z3009 Encounter for other general counseling and advice on contraception: Secondary | ICD-10-CM | POA: Diagnosis not present

## 2023-01-17 MED ORDER — LOPERAMIDE HCL 2 MG PO TABS: 2.0000 mg | ORAL_TABLET | Freq: Four times a day (QID) | ORAL | 0 refills | Status: DC | PRN

## 2023-01-17 MED ORDER — ONDANSETRON HCL 4 MG PO TABS: 4.0000 mg | ORAL_TABLET | Freq: Three times a day (TID) | ORAL | 0 refills | Status: DC | PRN

## 2023-01-17 NOTE — Progress Notes (Signed)
Patient ID: Angela Ramos, female    DOB: 1995-12-24, 27 y.o.   MRN: [id number]  Chief Complaint  Patient presents with   Diarrhea    Pt c/o diarrhea with blood, nausea and abdominal pain, Present yesterday. No diarrhea or blood today loose stool.    Contraception    Pt c/o off and on spotting, cramps and nausea. Pt states she stopped her Birth control for 2 months and started back on it.     HPI:      GI sx:     reports having nausea since Sunday, then diarrhea started yesterday. Has been taking probiotic gummies. Possibly from some food she ate at a family gathering. Reports 4 loose stools yest am, reports blood in water, started yesterday am. and also reports cramping.   Contraception concern:  had been taking Kurvelo, stopped and then restarted and has taken every day for last 2.5 weeks, having Nausea and mood swings.   Assessment & Plan:  1. Gastroenteritis - sending Zofran and Imodium, advised on use & SE of both meds. Advised on importance of hydration, BRAT diet, call back if sx are not improving by next week.  - ondansetron (ZOFRAN) 4 MG tablet; Take 1 tablet (4 mg total) by mouth every 8 (eight) hours as needed for nausea or vomiting.  Dispense: 30 tablet; Refill: 0 - loperamide (IMODIUM A-D) 2 MG tablet; Take 1 tablet (2 mg total) by mouth 4 (four) times daily as needed for diarrhea or loose stools (Max dose 6 pills in 24 hours).  Dispense: 20 tablet; Refill: 0  2. Encounter for counseling regarding contraception - pt prescribed OCP thru GYN, advised she call to schedule f/u to discuss current sx, possibly changing OCPs. Due to her endometriosis, this is better managed by a specialist.  Subjective:    Outpatient Medications Prior to Visit  Medication Sig Dispense Refill   albuterol (VENTOLIN HFA) 108 (90 Base) MCG/ACT inhaler Inhale 2 puffs into the lungs every 6 (six) hours as needed for wheezing or shortness of breath. 1 each 0   citalopram (CELEXA) 10 MG tablet  Take 10 mg by mouth daily.     diclofenac Sodium (VOLTAREN) 1 % GEL Apply 4 g topically 4 (four) times daily as needed (for pain). 50 g 2   KURVELO 0.15-30 MG-MCG tablet Take 1 tablet by mouth daily.     pantoprazole (PROTONIX) 20 MG tablet Take 1 tablet (20 mg total) by mouth daily. Take 1 pill twice a day for 2 weeks, then 1 pill every morning. 45 tablet 5   No facility-administered medications prior to visit.   Past Medical History:  Diagnosis Date   Abscess    Asthma    Depression    Endometriosis    GERD (gastroesophageal reflux disease)    Panic attacks    Past Surgical History:  Procedure Laterality Date   Leg mass removed     MYRINGOTOMY WITH TUBE PLACEMENT     TURBINATE REDUCTION Bilateral 08/30/2017   No Known Allergies    Objective:    Physical Exam Vitals and nursing note reviewed.  Constitutional:      Appearance: Normal appearance.  Cardiovascular:     Rate and Rhythm: Normal rate and regular rhythm.  Pulmonary:     Effort: Pulmonary effort is normal.     Breath sounds: Normal breath sounds.  Musculoskeletal:        General: Normal range of motion.  Skin:    General: Skin is  warm and dry.  Neurological:     Mental Status: She is alert.  Psychiatric:        Mood and Affect: Mood normal.        Behavior: Behavior normal.    BP 104/74 (BP Location: Left Arm, Patient Position: Sitting, Cuff Size: Normal)   Pulse 87   Temp 97.7 F (36.5 C) (Temporal)   Ht  (1.549 m)   Wt 139 lb (63 kg)   LMP  (LMP Unknown)   SpO2 100%   BMI 26.26 kg/m  Wt Readings from Last 3 Encounters:  01/17/23 139 lb (63 kg)  09/25/22 134 lb 8 oz (61 kg)  01/23/22 119 lb 0.8 oz (54 kg)      Dulce Sellar, NP

## 2023-02-04 ENCOUNTER — Encounter: Payer: Self-pay | Admitting: Family

## 2023-02-04 ENCOUNTER — Other Ambulatory Visit: Payer: Self-pay

## 2023-02-04 DIAGNOSIS — J4520 Mild intermittent asthma, uncomplicated: Secondary | ICD-10-CM

## 2023-02-04 MED ORDER — ALBUTEROL SULFATE HFA 108 (90 BASE) MCG/ACT IN AERS: 2.0000 | INHALATION_SPRAY | Freq: Four times a day (QID) | RESPIRATORY_TRACT | 0 refills | Status: DC | PRN

## 2023-02-04 NOTE — Telephone Encounter (Signed)
Thank you - let Angela Ramos know inhaler sent and she would need to be seen in the office to get prednisone, thx

## 2023-02-06 ENCOUNTER — Ambulatory Visit (INDEPENDENT_AMBULATORY_CARE_PROVIDER_SITE_OTHER): Payer: 59 | Admitting: Family

## 2023-02-06 VITALS — BP 116/78 | HR 80 | Temp 98.0°F | Ht 61.0 in | Wt 142.2 lb

## 2023-02-06 DIAGNOSIS — J4520 Mild intermittent asthma, uncomplicated: Secondary | ICD-10-CM | POA: Diagnosis not present

## 2023-02-06 DIAGNOSIS — R053 Chronic cough: Secondary | ICD-10-CM

## 2023-02-06 MED ORDER — ALBUTEROL SULFATE HFA 108 (90 BASE) MCG/ACT IN AERS: 2.0000 | INHALATION_SPRAY | Freq: Four times a day (QID) | RESPIRATORY_TRACT | 0 refills | Status: AC | PRN

## 2023-02-06 NOTE — Progress Notes (Signed)
Patient ID: Angela Ramos, female    DOB: 1995/11/17, 27 y.o.   MRN: [id number]  Chief Complaint  Patient presents with   Shortness of Breath    Pt c/o shortness of breath and chest congestion after having COVID last week. Pt states she is feeling pressure in chest when breathing deeply.     HPI:      SOB:    Pt c/o shortness of breath and chest congestion after having COVID last week. Pt states she is feeling pressure in chest when breathing deeply.  Reports her sx started as sore throat and sinus sx and then started coughing just a couple of days ago, but no mucus coming. Tried Mucinex bid for cough/congestion. Reports mild fever a week ago.   Assessment & Plan:  1. Mild intermittent asthma without complication - lungs ok today; sx exacerbated by recent covid infection, sending refill, advised to let me know if sx are not improving.  - albuterol (VENTOLIN HFA) 108 (90 Base) MCG/ACT inhaler; Inhale 2 puffs into the lungs every 6 (six) hours as needed for wheezing or shortness of breath.  Dispense: 1 each; Refill: 0  2. Persistent cough - believe r/t Asthma exacerbation d/t recent covid infection, pt did not have a rescue on hand, sending refill. Advised to also continue Flonase qd thru end of month for allergy sx & will also help resolving viral sx. Also recommend saline nasal spray tid & prior to Flonase, drink plenty of water.  - albuterol (VENTOLIN HFA) 108 (90 Base) MCG/ACT inhaler; Inhale 2 puffs into the lungs every 6 (six) hours as needed for wheezing or shortness of breath.  Dispense: 1 each; Refill: 0  Subjective:    Outpatient Medications Prior to Visit  Medication Sig Dispense Refill   citalopram (CELEXA) 10 MG tablet Take 10 mg by mouth daily.     diclofenac Sodium (VOLTAREN) 1 % GEL Apply 4 g topically 4 (four) times daily as needed (for pain). 50 g 2   KURVELO 0.15-30 MG-MCG tablet Take 1 tablet by mouth daily.     loperamide (IMODIUM A-D) 2 MG tablet Take 1  tablet (2 mg total) by mouth 4 (four) times daily as needed for diarrhea or loose stools (Max dose 6 pills in 24 hours). 20 tablet 0   ondansetron (ZOFRAN) 4 MG tablet Take 1 tablet (4 mg total) by mouth every 8 (eight) hours as needed for nausea or vomiting. 30 tablet 0   pantoprazole (PROTONIX) 20 MG tablet Take 1 tablet (20 mg total) by mouth daily. Take 1 pill twice a day for 2 weeks, then 1 pill every morning. 45 tablet 5   albuterol (VENTOLIN HFA) 108 (90 Base) MCG/ACT inhaler Inhale 2 puffs into the lungs every 6 (six) hours as needed for wheezing or shortness of breath. 1 each 0   No facility-administered medications prior to visit.   Past Medical History:  Diagnosis Date   Abscess    Asthma    Depression    Endometriosis    GERD (gastroesophageal reflux disease)    Panic attacks    Past Surgical History:  Procedure Laterality Date   Leg mass removed     MYRINGOTOMY WITH TUBE PLACEMENT     TURBINATE REDUCTION Bilateral 08/30/2017   No Known Allergies    Objective:    Physical Exam Vitals and nursing note reviewed.  Constitutional:      Appearance: Normal appearance.  HENT:     Right Ear: No drainage.  No middle ear effusion. Tympanic membrane is scarred. Tympanic membrane is not erythematous or bulging.     Left Ear: No drainage.  No middle ear effusion. Tympanic membrane is scarred. Tympanic membrane is not erythematous or bulging.     Mouth/Throat:     Mouth: Mucous membranes are moist.     Pharynx: No pharyngeal swelling, oropharyngeal exudate, posterior oropharyngeal erythema or uvula swelling.  Cardiovascular:     Rate and Rhythm: Normal rate and regular rhythm.  Pulmonary:     Effort: Pulmonary effort is normal.     Breath sounds: Normal breath sounds.  Musculoskeletal:        General: Normal range of motion.  Lymphadenopathy:     Cervical: No cervical adenopathy.     Right cervical: No superficial cervical adenopathy.    Left cervical: No superficial  cervical adenopathy.  Skin:    General: Skin is warm and dry.  Neurological:     Mental Status: She is alert.  Psychiatric:        Mood and Affect: Mood normal.        Behavior: Behavior normal.    BP 116/78 (BP Location: Left Arm, Patient Position: Sitting, Cuff Size: Normal)   Pulse 80   Temp 98 F (36.7 C) (Temporal)   Ht 5\' 1"  (1.549 m)   Wt 142 lb 4 oz (64.5 kg)   LMP  (LMP Unknown)   SpO2 100%   BMI 26.88 kg/m  Wt Readings from Last 3 Encounters:  02/06/23 142 lb 4 oz (64.5 kg)  01/17/23 139 lb (63 kg)  09/25/22 134 lb 8 oz (61 kg)      Dulce Sellar, NP

## 2023-03-07 ENCOUNTER — Encounter: Payer: Self-pay | Admitting: Family

## 2023-03-07 ENCOUNTER — Ambulatory Visit (INDEPENDENT_AMBULATORY_CARE_PROVIDER_SITE_OTHER): Payer: 59 | Admitting: Family

## 2023-03-07 VITALS — BP 112/80 | HR 82 | Temp 97.7°F | Ht 61.0 in | Wt 142.1 lb

## 2023-03-07 DIAGNOSIS — Z0000 Encounter for general adult medical examination without abnormal findings: Secondary | ICD-10-CM | POA: Diagnosis not present

## 2023-03-07 DIAGNOSIS — F411 Generalized anxiety disorder: Secondary | ICD-10-CM | POA: Diagnosis not present

## 2023-03-07 DIAGNOSIS — M79605 Pain in left leg: Secondary | ICD-10-CM | POA: Diagnosis not present

## 2023-03-07 LAB — COMPREHENSIVE METABOLIC PANEL
ALT: 19 U/L (ref 0–35)
AST: 20 U/L (ref 0–37)
Albumin: 4.4 g/dL (ref 3.5–5.2)
Alkaline Phosphatase: 76 U/L (ref 39–117)
BUN: 10 mg/dL (ref 6–23)
CO2: 26 mEq/L (ref 19–32)
Calcium: 9.4 mg/dL (ref 8.4–10.5)
Chloride: 104 mEq/L (ref 96–112)
Creatinine, Ser: 1.03 mg/dL (ref 0.40–1.20)
GFR: 75.45 mL/min (ref >60.00)
Glucose, Bld: 88 mg/dL (ref 70–99)
Potassium: 4.3 mEq/L (ref 3.5–5.1)
Sodium: 140 mEq/L (ref 135–145)
Total Bilirubin: 0.4 mg/dL (ref 0.2–1.2)
Total Protein: 7.7 g/dL (ref 6.0–8.3)

## 2023-03-07 LAB — LIPID PANEL
Cholesterol: 131 mg/dL (ref 0–200)
HDL: 36.4 mg/dL — ABNORMAL LOW (ref >39.00)
LDL Cholesterol: 78 mg/dL (ref 0–99)
NonHDL: 94.18
Total CHOL/HDL Ratio: 4
Triglycerides: 80 mg/dL (ref 0.0–149.0)
VLDL: 16 mg/dL (ref 0.0–40.0)

## 2023-03-07 LAB — CBC WITH DIFFERENTIAL/PLATELET
Basophils Absolute: 0 10*3/uL (ref 0.0–0.1)
Basophils Relative: 0.5 % (ref 0.0–3.0)
Eosinophils Absolute: 0.1 10*3/uL (ref 0.0–0.7)
Eosinophils Relative: 0.9 % (ref 0.0–5.0)
HCT: 45.3 % (ref 36.0–46.0)
Hemoglobin: 14.9 g/dL (ref 12.0–15.0)
Lymphocytes Relative: 21.4 % (ref 12.0–46.0)
Lymphs Abs: 1.7 10*3/uL (ref 0.7–4.0)
MCHC: 32.8 g/dL (ref 30.0–36.0)
MCV: 86.9 fl (ref 78.0–100.0)
Monocytes Absolute: 0.4 10*3/uL (ref 0.1–1.0)
Monocytes Relative: 5.1 % (ref 3.0–12.0)
Neutro Abs: 5.8 10*3/uL (ref 1.4–7.7)
Neutrophils Relative %: 72.1 % (ref 43.0–77.0)
Platelets: 293 10*3/uL (ref 150.0–400.0)
RBC: 5.21 Mil/uL — ABNORMAL HIGH (ref 3.87–5.11)
RDW: 14.1 % (ref 11.5–15.5)
WBC: 8 10*3/uL (ref 4.0–10.5)

## 2023-03-07 LAB — TSH: TSH: 0.64 u[IU]/mL (ref 0.35–5.50)

## 2023-03-07 MED ORDER — VENLAFAXINE HCL ER 37.5 MG PO CP24
37.5000 mg | ORAL_CAPSULE | Freq: Every day | ORAL | 2 refills | Status: DC
Start: 2023-03-07 — End: 2023-05-08

## 2023-03-07 NOTE — Assessment & Plan Note (Signed)
chronic has taken Zoloft in past, caused drowsiness  reports switching the Celexa to qhs and still caused drowsiness  switching to Effexor, advised on use & SE f/u in 2 mos can be virtual

## 2023-03-07 NOTE — Progress Notes (Signed)
Phone [phone]  Subjective:   Patient is a 27 y.o. female presenting for annual physical.    Chief Complaint  Patient presents with   Annual Exam    Fasting w/ labs   Leg Pain    Pt c/o left leg pain for a couple of months , no injury.    HPI: Leg pain: pt reports tenderness on the inside of her lower leg when she rubs it. Denies any neuropathies, no pain w/walking, mild pain when flexing her foot, denies any known injury, works out occasionally, but not consistent. Anxiety:  sx started in 2020.  But did not seek tx until this year d/t no ins and trying to handle sx on her own.  Found a provider and started on Celexa which has helped her sx but caused her drowsiness,  advised pt to switch to qhs last visit, which she did but still reports drowsiness during day. Reports taking Zoloft in past and it also caused drowsiness (& took at bedtime).  See problem oriented charting- ROS- full  review of systems was completed and negative except for leg pain noted in HPI above.  The following were reviewed and entered/updated in epic: Past Medical History:  Diagnosis Date   Abscess    Asthma    Depression    Encounter for management and injection of injectable progestin contraceptive 06/01/2014   Endometriosis    GERD (gastroesophageal reflux disease)    Panic attacks    Routine screening for STI (sexually transmitted infection) 06/01/2014   Patient Active Problem List   Diagnosis Date Noted   Generalized anxiety disorder 09/25/2022   Left hip pain 09/25/2022   Mild intermittent asthma without complication 09/25/2022   Endometriosis 05/18/2022   Dysmenorrhea 06/01/2014   Past Surgical History:  Procedure Laterality Date   Leg mass removed     MYRINGOTOMY WITH TUBE PLACEMENT     TURBINATE REDUCTION Bilateral 08/30/2017    Family History  Problem Relation Age of Onset   Diabetes Maternal Aunt    Hypertension Maternal Aunt    Hypertension Maternal Grandmother    Arthritis  Maternal Grandmother     Medications- reviewed and updated Current Outpatient Medications  Medication Sig Dispense Refill   albuterol (VENTOLIN HFA) 108 (90 Base) MCG/ACT inhaler Inhale 2 puffs into the lungs every 6 (six) hours as needed for wheezing or shortness of breath. 1 each 0   diclofenac Sodium (VOLTAREN) 1 % GEL Apply 4 g topically 4 (four) times daily as needed (for pain). 50 g 2   KURVELO 0.15-30 MG-MCG tablet Take 1 tablet by mouth daily.     loperamide (IMODIUM A-D) 2 MG tablet Take 1 tablet (2 mg total) by mouth 4 (four) times daily as needed for diarrhea or loose stools (Max dose 6 pills in 24 hours). 20 tablet 0   ondansetron (ZOFRAN) 4 MG tablet Take 1 tablet (4 mg total) by mouth every 8 (eight) hours as needed for nausea or vomiting. 30 tablet 0   pantoprazole (PROTONIX) 20 MG tablet Take 1 tablet (20 mg total) by mouth daily. Take 1 pill twice a day for 2 weeks, then 1 pill every morning. 45 tablet 5   venlafaxine XR (EFFEXOR XR) 37.5 MG 24 hr capsule Take 1 capsule (37.5 mg total) by mouth daily with breakfast. 30 capsule 2   No current facility-administered medications for this visit.    Allergies-reviewed and updated No Known Allergies  Social History   Social History Narrative   Not  on file    Objective:  BP 112/80 (BP Location: Left Arm, Patient Position: Sitting, Cuff Size: Normal)   Pulse 82   Temp 97.7 F (36.5 C) (Temporal)   Ht 5\' 1"  (1.549 m)   Wt 142 lb 2 oz (64.5 kg)   LMP  (LMP Unknown) Comment: PILL  SpO2 96%   BMI 26.85 kg/m  Physical Exam Vitals and nursing note reviewed.  Constitutional:      Appearance: Normal appearance.  HENT:     Head: Normocephalic.     Right Ear: Tympanic membrane normal.     Left Ear: Tympanic membrane normal.     Nose: Nose normal.     Mouth/Throat:     Mouth: Mucous membranes are moist.  Eyes:     Pupils: Pupils are equal, round, and reactive to light.  Cardiovascular:     Rate and Rhythm: Normal rate  and regular rhythm.  Pulmonary:     Effort: Pulmonary effort is normal.     Breath sounds: Normal breath sounds.  Musculoskeletal:        General: Normal range of motion.     Cervical back: Normal range of motion.  Lymphadenopathy:     Cervical: No cervical adenopathy.  Skin:    General: Skin is warm and dry.  Neurological:     Mental Status: She is alert.  Psychiatric:        Mood and Affect: Mood normal.        Behavior: Behavior normal.      Assessment and Plan   Health Maintenance counseling: 1. Anticipatory guidance: Patient counseled regarding regular dental exams q6 months, eye exams,  avoiding smoking and second hand smoke, limiting alcohol to 1 beverage per day, no illicit drugs.   2. Risk factor reduction:  Advised patient of need for regular exercise and diet rich with fruits and vegetables to reduce risk of heart attack and stroke. Wt Readings from Last 3 Encounters:  03/07/23 142 lb 2 oz (64.5 kg)  02/06/23 142 lb 4 oz (64.5 kg)  01/17/23 139 lb (63 kg)   3. Immunizations/screenings/ancillary studies Immunization History  Administered Date(s) Administered   DTaP 08/23/1997, 10/20/1997, 12/20/1997, 10/05/1998, 06/10/2001   HIB (PRP-OMP) 08/23/1997, 10/20/1997, 12/20/1997, 07/05/1998   HPV Quadrivalent 03/17/2008, 05/18/2008, 03/22/2009   Hepatitis A 03/17/2007, 03/17/2008   Hepatitis A, Ped/Adol-2 Dose 03/17/2007, 03/17/2008   Hepatitis B 07/18/97, 08/23/1997, 12/20/1997   Hepatitis B, PED/ADOLESCENT 07/26/97, 08/23/1997, 12/20/1997   IPV 08/23/1997, 10/20/1997, 07/05/1998, 06/10/2001   Influenza,inj,Quad PF,6+ Mos 07/04/2022   Influenza-Unspecified 07/28/2010, 07/09/2011, 09/05/2012, 09/07/2013   MMR 07/05/1998, 06/10/2001   Meningococcal Conjugate 03/22/2009, 09/07/2013   Meningococcal Mcv4o 09/07/2013   PFIZER(Purple Top)SARS-COV-2 Vaccination 09/27/2020, 10/18/2020   Polio, Unspecified 08/23/1997, 10/20/1997, 07/05/1998, 06/10/2001   Td 03/17/2008    Tdap 03/17/2008   Varicella 06/01/1999, 03/17/2007   Health Maintenance Due  Topic Date Due   Hepatitis C Screening  Never done   PAP-Cervical Cytology Screening  Never done   PAP SMEAR-Modifier  Never done    4. Cervical cancer screening: 2023 5. Skin cancer screening- advised regular sunscreen use. Denies worrisome, changing, or new skin lesions.  6. Birth control/STD check: OCP 7. Smoking associated screening: non- smoker 8. Alcohol screening: 1 drink per month  Annual physical exam -     CBC with Differential/Platelet -     Comprehensive metabolic panel -     Lipid panel -     TSH -     Hepatitis C  antibody -     [facility] SCREENING [facility]  Generalized anxiety disorder Assessment & Plan: chronic has taken Zoloft in past, caused drowsiness  reports switching the Celexa to qhs and still caused drowsiness  switching to Effexor, advised on use & SE f/u in 2 mos can be virtual  Orders: -     Venlafaxine HCl ER; Take 1 capsule (37.5 mg total) by mouth daily with breakfast.  Dispense: 30 capsule; Refill: 2  Acute leg pain, left - mild tenderness in muscle w/palpation, no bruising noted, no lump, erythema or swelling. Advised pt she is low risk for DVT,  can apply heat or ice up to tid prn, continue to monitor and let me know if not resolving in another month.   Recommended follow up:  Return in about 2 months (around 05/07/2023) for anxiety - virtual or in-office. No future appointments.  Lab/Order associations: fasting    Dulce Sellar, NP

## 2023-03-07 NOTE — Patient Instructions (Addendum)
It was very nice to see you today!   I will review your lab results via MyChart in a few days.  I have sent the new medication generic Effexor to your pharmacy, start this tomorrow am.  Schedule a 2 month follow up visit so we can discuss how this is working - ok to do a virtual visit for this!       PLEASE NOTE:  If you had any lab tests please let us know if you have not heard back within a few days. You may see your results on MyChart before we have a chance to review them but we will give you a call once they are reviewed by [place]. If we ordered any referrals today, please let us know if you have not heard from their office within the next week.

## 2023-03-08 LAB — HEPATITIS C ANTIBODY: Hepatitis C Ab: NONREACTIVE

## 2023-03-31 ENCOUNTER — Other Ambulatory Visit: Payer: Self-pay | Admitting: Family

## 2023-04-01 ENCOUNTER — Other Ambulatory Visit (HOSPITAL_COMMUNITY): Payer: Self-pay

## 2023-04-01 MED ORDER — KURVELO 0.15-30 MG-MCG PO TABS
1.0000 | ORAL_TABLET | Freq: Every day | ORAL | 0 refills | Status: DC
  Filled 2023-04-01: qty 28, 28d supply, fill #0

## 2023-04-02 ENCOUNTER — Other Ambulatory Visit (HOSPITAL_COMMUNITY): Payer: Self-pay

## 2023-04-05 ENCOUNTER — Other Ambulatory Visit (HOSPITAL_COMMUNITY): Payer: Self-pay

## 2023-04-09 ENCOUNTER — Other Ambulatory Visit (HOSPITAL_COMMUNITY): Payer: Self-pay

## 2023-04-10 ENCOUNTER — Other Ambulatory Visit (HOSPITAL_COMMUNITY): Payer: Self-pay

## 2023-04-26 ENCOUNTER — Other Ambulatory Visit (HOSPITAL_COMMUNITY): Payer: Self-pay

## 2023-04-26 ENCOUNTER — Other Ambulatory Visit: Payer: Self-pay | Admitting: Family

## 2023-04-26 MED ORDER — KURVELO 0.15-30 MG-MCG PO TABS
1.0000 | ORAL_TABLET | Freq: Every day | ORAL | 0 refills | Status: DC
  Filled 2023-04-26: qty 28, 28d supply, fill #0

## 2023-05-08 ENCOUNTER — Telehealth (INDEPENDENT_AMBULATORY_CARE_PROVIDER_SITE_OTHER): Payer: 59 | Admitting: Family

## 2023-05-08 ENCOUNTER — Encounter: Payer: Self-pay | Admitting: Family

## 2023-05-08 VITALS — Ht 61.0 in | Wt 146.0 lb

## 2023-05-08 DIAGNOSIS — F411 Generalized anxiety disorder: Secondary | ICD-10-CM

## 2023-05-08 MED ORDER — VENLAFAXINE HCL ER 37.5 MG PO CP24
37.5000 mg | ORAL_CAPSULE | Freq: Every day | ORAL | 1 refills | Status: DC
Start: 2023-05-08 — End: 2023-12-03

## 2023-05-08 NOTE — Progress Notes (Signed)
MyChart Video Visit    Virtual Visit via Video Note   This format is felt to be most appropriate for this patient at this time. Physical exam was limited by quality of the video and audio technology used for the visit. CMA was able to get the patient set up on a video visit.  Patient location: Home. Patient and provider in visit Provider location: Office  I discussed the limitations of evaluation and management by telemedicine and the availability of in person appointments. The patient expressed understanding and agreed to proceed.  Visit Date: 05/08/2023  Today's healthcare provider: Dulce Sellar, NP     Subjective:   Patient ID: Angela Ramos, female    DOB: 09/27/1996, 27 y.o.   MRN: [id number]  Chief Complaint  Patient presents with   Anxiety    Follow up    HPI Anxiety/Depression: Patient complains of anxiety.   She has the following symptoms: difficulty concentrating, feelings of losing control, irritable, palpitations, racing thoughts, sweating. Onset of symptoms was approximately  years ago, She denies current suicidal and homicidal ideation. Risk factors:  work, family stressors   Previous treatment includes Celexa and Zoloft.  She complains of the following side effects from the treatment:  drowsiness .  *Last visit started Effexor 37.5mg , pt reports doing well, feeling better. Had no energy still once she started med, but stopped her OCP and says this has helped. Has f/u with OB to discuss other options.   Assessment & Plan:  There are no diagnoses linked to this encounter.  Past Medical History:  Diagnosis Date   Abscess    Asthma    Depression    Encounter for management and injection of injectable progestin contraceptive 06/01/2014   Endometriosis    GERD (gastroesophageal reflux disease)    Panic attacks    Routine screening for STI (sexually transmitted infection) 06/01/2014    Past Surgical History:  Procedure Laterality Date    Leg mass removed     MYRINGOTOMY WITH TUBE PLACEMENT     TURBINATE REDUCTION Bilateral 08/30/2017    Outpatient Medications Prior to Visit  Medication Sig Dispense Refill   albuterol (VENTOLIN HFA) 108 (90 Base) MCG/ACT inhaler Inhale 2 puffs into the lungs every 6 (six) hours as needed for wheezing or shortness of breath. 1 each 0   diclofenac Sodium (VOLTAREN) 1 % GEL Apply 4 g topically 4 (four) times daily as needed (for pain). 50 g 2   loperamide (IMODIUM A-D) 2 MG tablet Take 1 tablet (2 mg total) by mouth 4 (four) times daily as needed for diarrhea or loose stools (Max dose 6 pills in 24 hours). 20 tablet 0   ondansetron (ZOFRAN) 4 MG tablet Take 1 tablet (4 mg total) by mouth every 8 (eight) hours as needed for nausea or vomiting. 30 tablet 0   pantoprazole (PROTONIX) 20 MG tablet Take 1 tablet (20 mg total) by mouth daily. Take 1 pill twice a day for 2 weeks, then 1 pill every morning. 45 tablet 5   venlafaxine XR (EFFEXOR XR) 37.5 MG 24 hr capsule Take 1 capsule (37.5 mg total) by mouth daily with breakfast. 30 capsule 2   KURVELO 0.15-30 MG-MCG tablet Take 1 tablet by mouth daily. (Patient not taking: Reported on 05/08/2023) 28 tablet 0   No facility-administered medications prior to visit.    No Known Allergies     Objective:   Physical Exam Vitals and nursing note reviewed.  Constitutional:  General: Pt is not in acute distress.    Appearance: Normal appearance.  HENT:     Head: Normocephalic.  Pulmonary:     Effort: No respiratory distress.  Musculoskeletal:     Cervical back: Normal range of motion.  Skin:    General: Skin is dry.     Coloration: Skin is not pale.  Neurological:     Mental Status: Pt is alert and oriented to person, place, and time.  Psychiatric:        Mood and Affect: Mood normal.   Ht 5\' 1"  (1.549 m)   Wt 146 lb (66.2 kg)   LMP 04/29/2023 (Exact Date)   BMI 27.59 kg/m   Wt Readings from Last 3 Encounters:  05/08/23 146 lb (66.2 kg)   03/07/23 142 lb 2 oz (64.5 kg)  02/06/23 142 lb 4 oz (64.5 kg)        I discussed the assessment and treatment plan with the patient. The patient was provided an opportunity to ask questions and all were answered. The patient agreed with the plan and demonstrated an understanding of the instructions.   The patient was advised to call back or seek an in-person evaluation if the symptoms worsen or if the condition fails to improve as anticipated.  Dulce Sellar, NP [facility] [place] [phone] (phone) [phone] (fax)  [facility]

## 2023-05-08 NOTE — Assessment & Plan Note (Signed)
chronic, stable taking Effexor 37.5mg  daily, tolerating reports having more energy, sx under control interested in therapy, sending referral & given phone numbers to call f/u in 3 mos - virtual or in-office

## 2023-05-08 NOTE — Patient Instructions (Signed)
It was very nice to see you today!   I sent in your Effexor refill. I have also sent a referral to our therapy team for counseling.  But you have to call their office to schedule your first appointment.  Start with [facility] at [phone] if they can't schedule you quickly you can try [facility] at [phone].   Schedule a 3 month follow up visit.      PLEASE NOTE:  If you had any lab tests please let us know if you have not heard back within a few days. You may see your results on MyChart before we have a chance to review them but we will give you a call once they are reviewed by [place]. If we ordered any referrals today, please let us know if you have not heard from their office within the next week.

## 2023-06-02 IMAGING — CT CT ABD-PELV W/ CM
2 of 4 series · 16 of 46 positions shown, 18 images · IV contrast (OMNIPAQUE 350)
Comparison: None.

CLINICAL DATA: Right lower quadrant abdominal pain

EXAM:
CT ABDOMEN AND PELVIS WITH CONTRAST
TECHNIQUE: Multidetector CT imaging of the abdomen and pelvis was performed
using the standard protocol following bolus administration of
intravenous contrast.
CONTRAST:  75mL OMNIPAQUE IOHEXOL 350 MG/ML SOLN

[Series 2: axial st · axial · 0.68mm/px · z∈[-217,+133]mm · 13 of 80 slices shown, 15 images]
[im 5/80  soft-tissue]
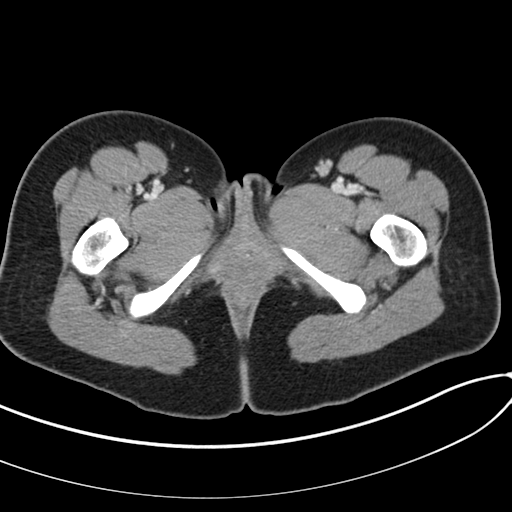
[im 5/80  bone]
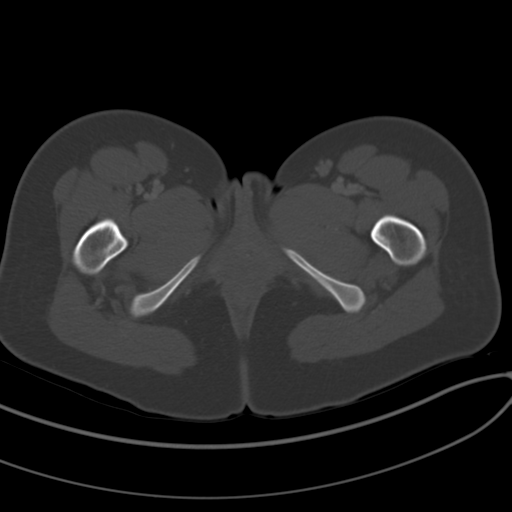
[im 10/80  soft-tissue]
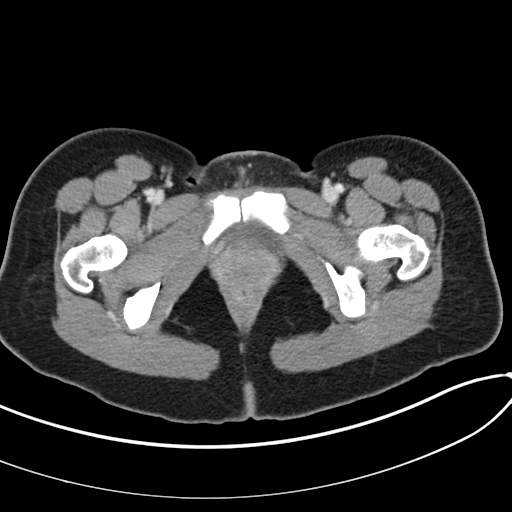
[im 19/80  soft-tissue]
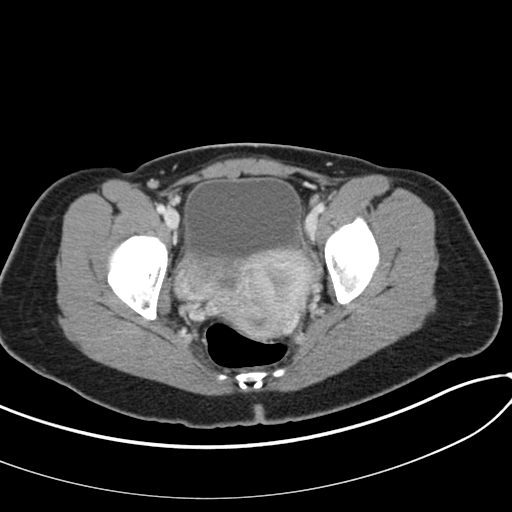
[im 24/80  soft-tissue]
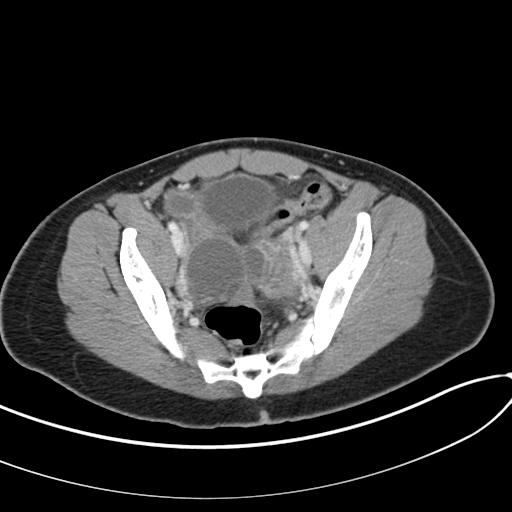
[im 28/80  soft-tissue]
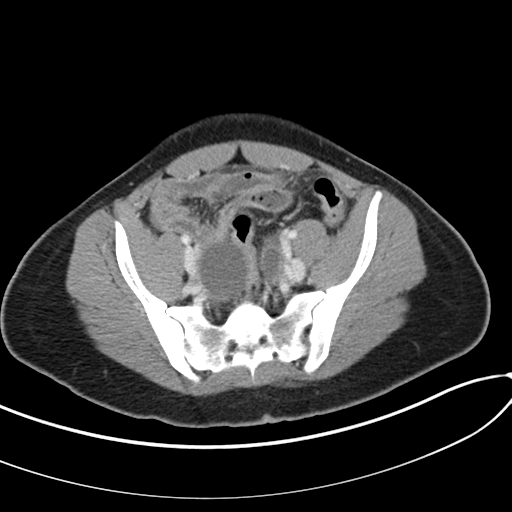
[im 33/80  soft-tissue]
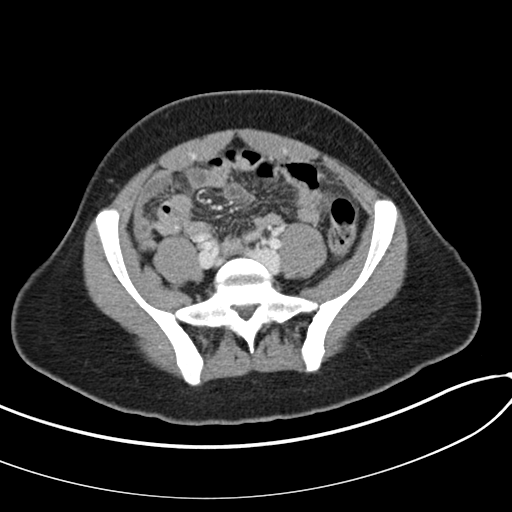
[im 42/80  soft-tissue]
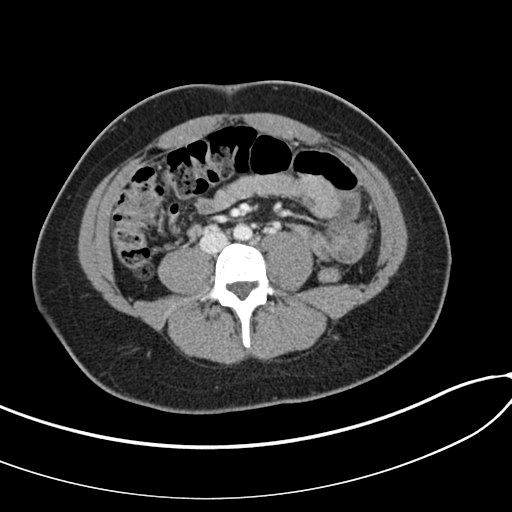
[im 47/80  soft-tissue]
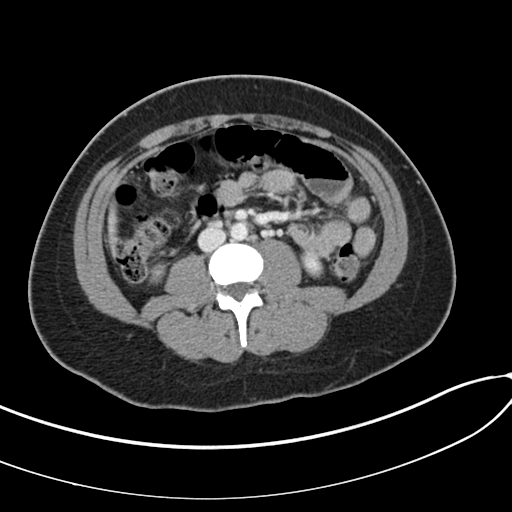
[im 52/80  soft-tissue]
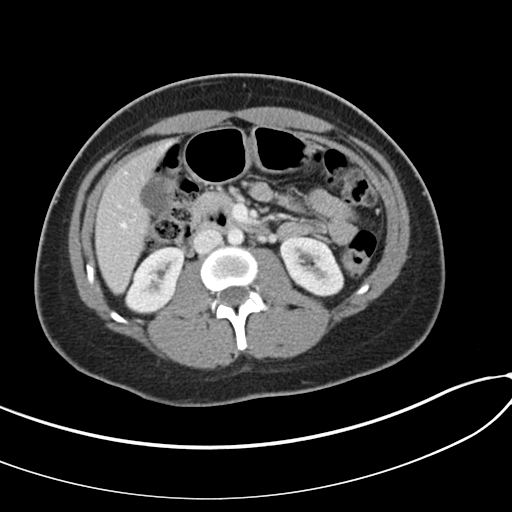
[im 52/80  bone]
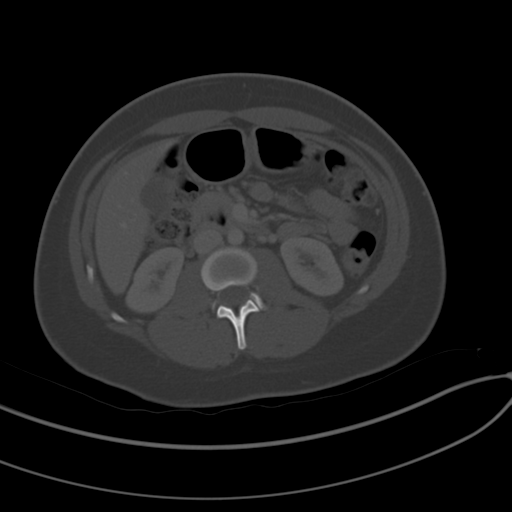
[im 56/80  soft-tissue]
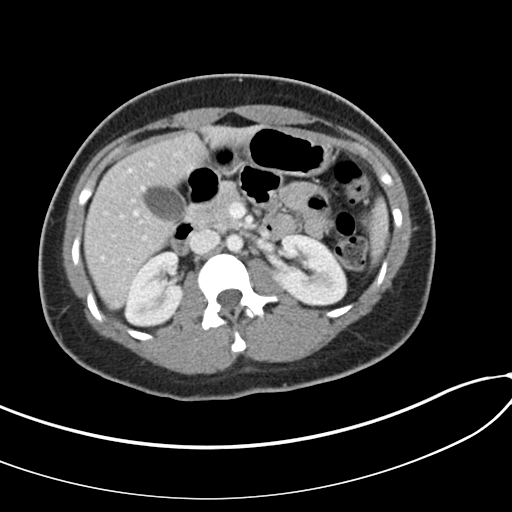
[im 61/80  soft-tissue]
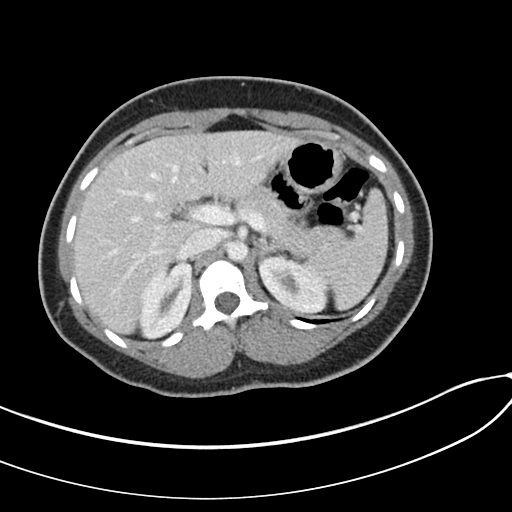
[im 70/80  soft-tissue]
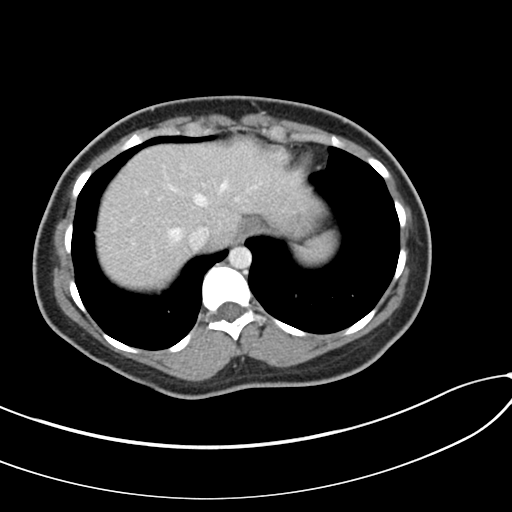
[im 75/80  soft-tissue]
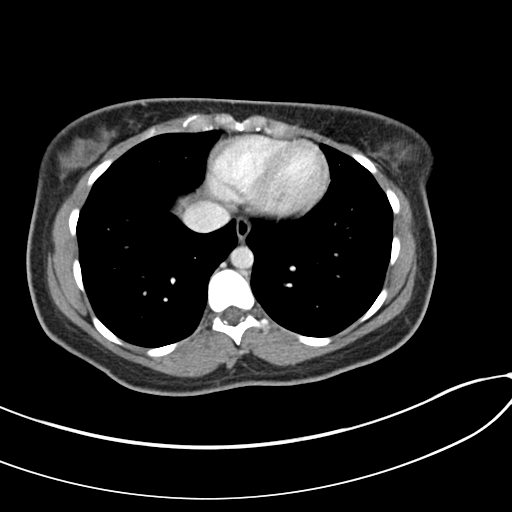

[Series 5: coronal st · coronal · 0.70mm/px · 3 of 97 slices shown]
[im 33/97  soft-tissue]
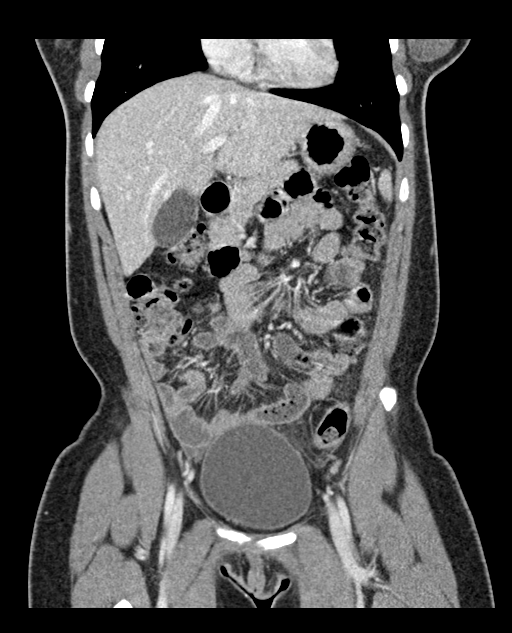
[im 43/97  soft-tissue]
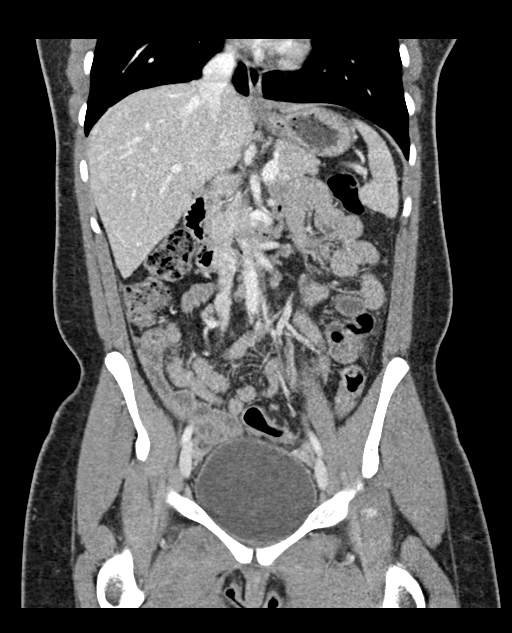
[im 54/97  soft-tissue]
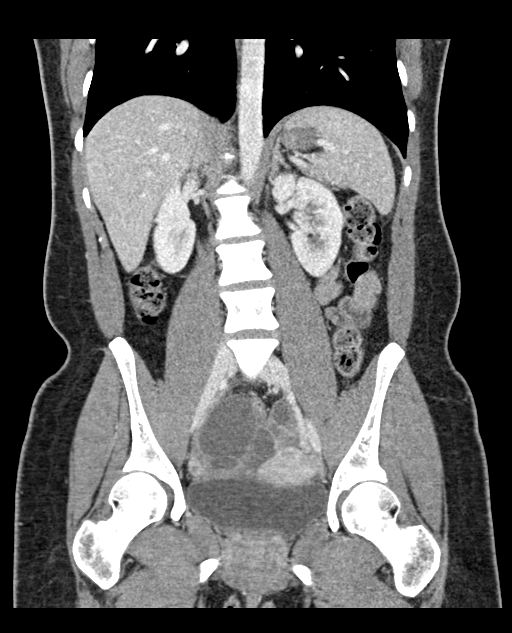

[16 of 46 positions shown; findings below may reference images not displayed]

FINDINGS: Lower chest: The lung bases are clear. The imaged heart is
unremarkable.

Hepatobiliary: The liver and gallbladder are unremarkable. There is
no biliary ductal dilatation.

Pancreas: Unremarkable.

Spleen: Unremarkable.

Adrenals/Urinary Tract: There is a 1.3 cm left adrenal nodule which
measures less than 10 Hounsfield units, consistent with an adenoma.
The right adrenal is unremarkable.

The kidneys are unremarkable, with no focal lesion, stone,
hydronephrosis, or hydroureter. The bladder is unremarkable

Stomach/Bowel: The stomach is unremarkable. There is no evidence of
bowel obstruction. There is no abnormal bowel wall thickening or
inflammatory change. The appendix is unremarkable (2-46).

Vascular/Lymphatic: The abdominal aorta is normal in course and
caliber. The major branch vessels are patent. The main portal and
splenic veins are patent. There is no abdominal or pelvic
lymphadenopathy.

Reproductive: The uterus is within normal limits for age. There are
multiple follicles in the left ovary. There is a dominant septated
right ovarian cyst measuring up to 4.3 cm x 3.5 cm.

Other: There is no ascites or free air.

Musculoskeletal: There is slight dextroscoliosis of the spine
centered at T12. There is no acute osseous abnormality or aggressive
osseous lesion.
IMPRESSION: 1. Septated right ovarian cyst measuring up to 4.3 cm. Given the
history of right lower quadrant pain, pelvic ultrasound is suggested
for further evaluation.
2. Otherwise, no acute findings in the abdomen or pelvis.
Specifically, no evidence of appendicitis.
3. Small left adrenal adenoma.

## 2023-06-14 ENCOUNTER — Ambulatory Visit (INDEPENDENT_AMBULATORY_CARE_PROVIDER_SITE_OTHER): Payer: 59 | Admitting: Family

## 2023-06-14 ENCOUNTER — Ambulatory Visit: Payer: 59 | Admitting: Family

## 2023-06-14 VITALS — BP 116/78 | HR 100 | Temp 97.8°F | Ht 61.0 in | Wt 146.0 lb

## 2023-06-14 DIAGNOSIS — M79604 Pain in right leg: Secondary | ICD-10-CM

## 2023-06-14 DIAGNOSIS — M79605 Pain in left leg: Secondary | ICD-10-CM

## 2023-06-14 DIAGNOSIS — M25532 Pain in left wrist: Secondary | ICD-10-CM | POA: Diagnosis not present

## 2023-06-14 NOTE — Progress Notes (Signed)
Patient ID: Angela Ramos, female    DOB: [birth year], 27 y.o.   MRN: [id number]  Chief Complaint  Patient presents with   Wrist Pain    Pt experiencing left wrist pain   Leg Pain    Pt in office c/o BIL LE pain at the shin and side of left calf       *Discussed the use of AI scribe software for clinical note transcription with the patient, who gave verbal consent to proceed.  History of Present Illness   The patient, who works at a cancer center, presents with persistent pain in the left leg and wrist. The leg pain is located on the inside side of the left calf and sometimes extends to the shin, and she also has pain in her right shin. The pain is exacerbated by prolonged standing, walking, and especially during exercise such as running or walking. They also report foot pain after periods of activity.  In addition to the leg pain, the patient has been experiencing persistent pain in the left wrist. The pain began after a period of carrying heavy boxes daily for work earlier in the year. The pain is located on the top of the wrist, around the bone, and is tender to touch. There has been no swelling, numbness, or tingling.      Assessment & Plan:  Bilateral leg Pain- Possible shin splints due to overexertion. -Refer to physical therapy for evaluation and treatment. -Advise to ease into exercise, starting with light walking and avoiding running for now. - Ambulatory referral to Physical Therapy  Left Wrist Pain- Persistent pain on the top of the left wrist, possibly due to strain from heavy lifting earlier in the year. -Advise to apply ice for 15 minutes at a time, several times a day. -Recommend avoiding heavy lifting with the left hand. -Suggest considering an over-the-counter wrist brace or compression sleeve for support.   Subjective:    Outpatient Medications Prior to Visit  Medication Sig Dispense Refill   albuterol (VENTOLIN HFA) 108 (90 Base) MCG/ACT inhaler Inhale 2  puffs into the lungs every 6 (six) hours as needed for wheezing or shortness of breath. 1 each 0   diclofenac Sodium (VOLTAREN) 1 % GEL Apply 4 g topically 4 (four) times daily as needed (for pain). 50 g 2   loperamide (IMODIUM A-D) 2 MG tablet Take 1 tablet (2 mg total) by mouth 4 (four) times daily as needed for diarrhea or loose stools (Max dose 6 pills in 24 hours). 20 tablet 0   ondansetron (ZOFRAN) 4 MG tablet Take 1 tablet (4 mg total) by mouth every 8 (eight) hours as needed for nausea or vomiting. 30 tablet 0   pantoprazole (PROTONIX) 20 MG tablet Take 1 tablet (20 mg total) by mouth daily. Take 1 pill twice a day for 2 weeks, then 1 pill every morning. 45 tablet 5   venlafaxine XR (EFFEXOR XR) 37.5 MG 24 hr capsule Take 1 capsule (37.5 mg total) by mouth daily with breakfast. 90 capsule 1   KURVELO 0.15-30 MG-MCG tablet Take 1 tablet by mouth daily. (Patient not taking: Reported on 06/14/2023) 28 tablet 0   No facility-administered medications prior to visit.   Past Medical History:  Diagnosis Date   Abscess    Asthma    Depression    Encounter for management and injection of injectable progestin contraceptive 06/01/2014   Endometriosis    GERD (gastroesophageal reflux disease)    Panic attacks  Routine screening for STI (sexually transmitted infection) 06/01/2014   Past Surgical History:  Procedure Laterality Date   Leg mass removed     MYRINGOTOMY WITH TUBE PLACEMENT     TURBINATE REDUCTION Bilateral 08/30/2017   No Known Allergies    Objective:    Physical Exam Vitals and nursing note reviewed.  Constitutional:      Appearance: Normal appearance.  Cardiovascular:     Rate and Rhythm: Normal rate and regular rhythm.  Pulmonary:     Effort: Pulmonary effort is normal.     Breath sounds: Normal breath sounds.  Musculoskeletal:        General: Normal range of motion.  Skin:    General: Skin is warm and dry.  Neurological:     Mental Status: She is alert.   Psychiatric:        Mood and Affect: Mood normal.        Behavior: Behavior normal.    BP 116/78 (BP Location: Left Arm)   Pulse 100   Temp 97.8 F (36.6 C) (Temporal)   Ht 5\' 1"  (1.549 m)   Wt 146 lb (66.2 kg)   SpO2 100%   BMI 27.59 kg/m  Wt Readings from Last 3 Encounters:  06/14/23 146 lb (66.2 kg)  05/08/23 146 lb (66.2 kg)  03/07/23 142 lb 2 oz (64.5 kg)       Dulce Sellar, NP

## 2023-06-25 LAB — HM PAP SMEAR

## 2023-06-28 ENCOUNTER — Encounter: Payer: Self-pay | Admitting: Family

## 2023-07-03 ENCOUNTER — Other Ambulatory Visit: Payer: Self-pay | Admitting: Family

## 2023-07-03 DIAGNOSIS — K219 Gastro-esophageal reflux disease without esophagitis: Secondary | ICD-10-CM

## 2023-08-09 ENCOUNTER — Ambulatory Visit
Admission: EM | Admit: 2023-08-09 | Discharge: 2023-08-09 | Disposition: A | Discharge status: Home / Self Care | Payer: 59 | Attending: Emergency Medicine | Admitting: Emergency Medicine

## 2023-08-09 DIAGNOSIS — R519 Headache, unspecified: Secondary | ICD-10-CM

## 2023-08-09 LAB — POCT URINE PREGNANCY: Preg Test, Ur: NEGATIVE

## 2023-08-09 MED ORDER — ONDANSETRON HCL 4 MG PO TABS: 4.0000 mg | ORAL_TABLET | Freq: Four times a day (QID) | ORAL | 0 refills | Status: DC

## 2023-08-09 MED ORDER — BUTALBITAL-APAP-CAFFEINE 50-325-40 MG PO TABS
1.0000 | ORAL_TABLET | Freq: Four times a day (QID) | ORAL | 0 refills | Status: AC | PRN
Start: 2023-08-09 — End: 2023-08-11

## 2023-08-09 NOTE — Discharge Instructions (Signed)
Your pregnancy test is negative. Take fiorcet as prescribed, (do not take tylenol while taking fiorcet as it has tylenol in it)will make you sleepy. If your symptoms worsen or you have worst HA of your life go to Er for further evaluation.

## 2023-08-09 NOTE — ED Provider Notes (Signed)
[name]    CSN: [id number] Arrival date & time: 08/09/23  1758      History   Chief Complaint Chief Complaint  Patient presents with   Headache   Nausea    HPI Angela Ramos is a 27 y.o. female.   27 year old female, Angela Ramos, presents to urgent care for evaluation of blurred vision proceeding headache and nausea x 1 day.  Pt states it feels like tight band around head, rates it as "3/10". Pt denies any head injury,fall,or trauma. Patient states she has been treating her symptoms with Motrin and sinus med without relief, patient reports hx of migraines in middle school, seen last year for similar s/s.   Pt denies any cough,congestion or URI symptoms. Patient reports her mom died from a brain aneurysm.  Patient works in the [employer] and uses the computer regularly and frequently, has not had her vision checked recently  Vital signs are normal in office at present.  The history is provided by the patient. No language interpreter was used.    Past Medical History:  Diagnosis Date   Abscess    Asthma    Depression    Encounter for management and injection of injectable progestin contraceptive 06/01/2014   Endometriosis    GERD (gastroesophageal reflux disease)    Panic attacks    Routine screening for STI (sexually transmitted infection) 06/01/2014    Patient Active Problem List   Diagnosis Date Noted   Acute nonintractable headache 08/09/2023   Generalized anxiety disorder 09/25/2022   Left hip pain 09/25/2022   Mild intermittent asthma without complication 09/25/2022   Endometriosis 05/18/2022   Dysmenorrhea 06/01/2014    Past Surgical History:  Procedure Laterality Date   Leg mass removed     MYRINGOTOMY WITH TUBE PLACEMENT     TURBINATE REDUCTION Bilateral 08/30/2017    OB History   No obstetric history on file.      Home Medications    Prior to Admission medications   Medication Sig Start Date End Date Taking?  Authorizing Provider  butalbital-acetaminophen-caffeine (FIORICET) 50-325-40 MG tablet Take 1 tablet by mouth every 6 (six) hours as needed for up to 2 days for headache. 08/09/23 08/11/23 Yes Fredericka Bottcher, Para March, NP  ondansetron (ZOFRAN) 4 MG tablet Take 1 tablet (4 mg total) by mouth every 6 (six) hours. 08/09/23  Yes Harriet Sutphen, Para March, NP  albuterol (VENTOLIN HFA) 108 (90 Base) MCG/ACT inhaler Inhale 2 puffs into the lungs every 6 (six) hours as needed for wheezing or shortness of breath. 02/06/23 09/03/30  Dulce Sellar, NP  diclofenac Sodium (VOLTAREN) 1 % GEL Apply 4 g topically 4 (four) times daily as needed (for pain). 09/25/22   Dulce Sellar, NP  KURVELO 0.15-30 MG-MCG tablet Take 1 tablet by mouth daily. Patient not taking: Reported on 06/14/2023 04/26/23   Dulce Sellar, NP  loperamide (IMODIUM A-D) 2 MG tablet Take 1 tablet (2 mg total) by mouth 4 (four) times daily as needed for diarrhea or loose stools (Max dose 6 pills in 24 hours). 01/17/23   Dulce Sellar, NP  pantoprazole (PROTONIX) 20 MG tablet TAKE 1 TABLET BY MOUTH TWICE DAILY X 2 WEEKS THEN 1 TABLET EVERY MORNING 07/03/23   Dulce Sellar, NP  venlafaxine XR (EFFEXOR XR) 37.5 MG 24 hr capsule Take 1 capsule (37.5 mg total) by mouth daily with breakfast. 05/08/23   Dulce Sellar, NP    Family History Family History  Problem Relation Age of Onset  Diabetes Maternal Aunt    Hypertension Maternal Aunt    Hypertension Maternal Grandmother    Arthritis Maternal Grandmother     Social History Social History   Tobacco Use   Smoking status: Never   Smokeless tobacco: Never  Vaping Use   Vaping status: Never Used  Substance Use Topics   Alcohol use: No   Drug use: No     Allergies   Patient has no known allergies.   Review of Systems Review of Systems  Constitutional:  Negative for fever.  Eyes:  Positive for visual disturbance. Negative for photophobia.  Gastrointestinal:  Positive for  nausea.  Neurological:  Positive for headaches.  All other systems reviewed and are negative.    Physical Exam Triage Vital Signs ED Triage Vitals  Encounter Vitals Group     BP      Systolic BP Percentile      Diastolic BP Percentile      Pulse      Resp      Temp      Temp src      SpO2      Weight      Height      Head Circumference      Peak Flow      Pain Score      Pain Loc      Pain Education      Exclude from Growth Chart    No data found.  Updated Vital Signs BP 128/82 (BP Location: Left Arm)   Pulse 87   Temp 97.8 F (36.6 C) (Temporal)   Resp 16   LMP 07/29/2023 (Approximate)   SpO2 98%   Visual Acuity Right Eye Distance:   Left Eye Distance:   Bilateral Distance:    Right Eye Near:   Left Eye Near:    Bilateral Near:     Physical Exam Vitals and nursing note reviewed.  Constitutional:      General: She is not in acute distress.    Appearance: She is well-developed and well-groomed.  HENT:     Head: Normocephalic and atraumatic.  Eyes:     Conjunctiva/sclera: Conjunctivae normal.  Cardiovascular:     Rate and Rhythm: Normal rate and regular rhythm.     Pulses: Normal pulses.     Heart sounds: Normal heart sounds. No murmur heard. Pulmonary:     Effort: Pulmonary effort is normal. No respiratory distress.     Breath sounds: Normal breath sounds and air entry.  Abdominal:     Palpations: Abdomen is soft.     Tenderness: There is no abdominal tenderness.  Musculoskeletal:        General: No swelling.     Cervical back: Neck supple.  Skin:    General: Skin is warm and dry.     Capillary Refill: Capillary refill takes less than 2 seconds.  Neurological:     General: No focal deficit present.     Mental Status: She is alert and oriented to person, place, and time.     GCS: GCS eye subscore is 4. GCS verbal subscore is 5. GCS motor subscore is 6.     Cranial Nerves: No cranial nerve deficit.     Sensory: No sensory deficit.   Psychiatric:        Mood and Affect: Mood normal.        Behavior: Behavior is cooperative.      UC Treatments / Results  Labs (all labs ordered are  listed, but only abnormal results are displayed) Labs Reviewed  POCT URINE PREGNANCY    EKG   Radiology No results found.  Procedures Procedures (including critical care time)  Medications Ordered in UC Medications - No data to display  Initial Impression / Assessment and Plan / UC Course  I have reviewed the triage vital signs and the nursing notes.  Pertinent labs & imaging results that were available during my care of the patient were reviewed by me and considered in my medical decision making (see chart for details).    Discussed exam findings and plan of care with patient, strict go to ER precautions given.  Patient verbalized understanding this provider  Ddx: Tension HA, Eye strain, Ocular migraine, MHA,stress reaction, stroke/intracranial process,dehydration Final Clinical Impressions(s) / UC Diagnoses   Final diagnoses:  Acute nonintractable headache, unspecified headache type     Discharge Instructions      Your pregnancy test is negative. Take fiorcet as prescribed, (do not take tylenol while taking fiorcet as it has tylenol in it)will make you sleepy. If your symptoms worsen or you have worst HA of your life go to Er for further evaluation.      ED Prescriptions     Medication Sig Dispense Auth. Provider   butalbital-acetaminophen-caffeine (FIORICET) 50-325-40 MG tablet Take 1 tablet by mouth every 6 (six) hours as needed for up to 2 days for headache. 8 tablet Darrel Gloss, NP   ondansetron (ZOFRAN) 4 MG tablet Take 1 tablet (4 mg total) by mouth every 6 (six) hours. 6 tablet Zira Helinski, Para March, NP      PDMP not reviewed this encounter.   Clancy Gourd, NP 08/09/23 2041

## 2023-08-09 NOTE — ED Triage Notes (Signed)
Patient presents to UC for blurred vision, HA, and nausea since 1 day. States the pain feels like a tight headband. Treating with motrin and sinus med with no relief. No hx of migraines.

## 2023-08-10 ENCOUNTER — Other Ambulatory Visit: Payer: Self-pay

## 2023-08-10 ENCOUNTER — Emergency Department: Payer: 59

## 2023-08-10 ENCOUNTER — Emergency Department
Admission: EM | Admit: 2023-08-10 | Discharge: 2023-08-10 | Disposition: A | Discharge status: Home / Self Care | Payer: 59 | Attending: Emergency Medicine | Admitting: Emergency Medicine

## 2023-08-10 DIAGNOSIS — R42 Dizziness and giddiness: Secondary | ICD-10-CM | POA: Insufficient documentation

## 2023-08-10 DIAGNOSIS — J45909 Unspecified asthma, uncomplicated: Secondary | ICD-10-CM | POA: Insufficient documentation

## 2023-08-10 DIAGNOSIS — R519 Headache, unspecified: Secondary | ICD-10-CM | POA: Diagnosis present

## 2023-08-10 MED ORDER — MECLIZINE HCL 25 MG PO TABS: 25.0000 mg | ORAL_TABLET | Freq: Three times a day (TID) | ORAL | 0 refills | Status: DC | PRN

## 2023-08-10 MED ORDER — MECLIZINE HCL 25 MG PO TABS
25.0000 mg | ORAL_TABLET | Freq: Once | ORAL | Status: AC
  Administered 2023-08-10: 25 mg via ORAL
  Filled 2023-08-10: qty 1

## 2023-08-10 NOTE — ED Triage Notes (Signed)
Pt presents to ED for headache x 3 days.  States felt like a "band" around her head.  No frank headache today she reports but some dizziness.  She was seen at [facility] yesterday for same but has not taken the prescribed medication.

## 2023-08-10 NOTE — ED Provider Notes (Signed)
[facility] Provider Note    Event Date/Time   First MD Initiated Contact with Patient 08/10/23 1654     (approximate)   History   Headache   HPI  Angela Ramos is a 27 y.o. female history of asthma, GERD, anxiety presents emergency department complaint continued headache for 3 days.  States feels like a band around her head.  Complaint of dizziness today.  Seen in urgent care yesterday for the same but is not taking any of the occasions that she was provided.  States pains really not bad it is more of the dizziness.  No vomiting.  No fever or chills.      Physical Exam   Triage Vital Signs: ED Triage Vitals  Encounter Vitals Group     BP 08/10/23 1623 118/72     Systolic BP Percentile --      Diastolic BP Percentile --      Pulse Rate 08/10/23 1623 100     Resp 08/10/23 1623 17     Temp 08/10/23 1623 98.5 F (36.9 C)     Temp Source 08/10/23 1623 Oral     SpO2 08/10/23 1623 98 %     Weight --      Height --      Head Circumference --      Peak Flow --      Pain Score 08/10/23 1622 0     Pain Loc --      Pain Education --      Exclude from Growth Chart --     Most recent vital signs: Vitals:   08/10/23 1623  BP: 118/72  Pulse: 100  Resp: 17  Temp: 98.5 F (36.9 C)  SpO2: 98%     General: Awake, no distress.   CV:  Good peripheral perfusion. regular rate and  rhythm Resp:  Normal effort. Abd:  No distention.   Other:  Cranial nerves II through XII grossly intact   ED Results / Procedures / Treatments   Labs (all labs ordered are listed, but only abnormal results are displayed) Labs Reviewed - No data to display   EKG     RADIOLOGY CT of the head    PROCEDURES:   Procedures   MEDICATIONS ORDERED IN ED: Medications  meclizine (ANTIVERT) tablet 25 mg (25 mg Oral Given 08/10/23 1759)     IMPRESSION / MDM / ASSESSMENT AND PLAN / ED COURSE  I reviewed the triage vital signs and the nursing notes.                               Differential diagnosis includes, but is not limited to, SAH, subdural, migraine, dizziness, dehydration  Patient's presentation is most consistent with acute illness / injury with system symptoms.   Due to the continued dizziness and discomfort for 3 days with no history of migraines we will go ahead and order CT of the head  Did offer the patient pain medication.  She states she does not need it.  Was given meclizine for the dizziness.   Recheck of patient, states she is feeling better after the medication.  CT of the head, did independently review interpret the radiology readings as being negative for any acute abnormality  I did explain all findings to patient.  She was given a prescription for Antivert for any additional dizziness.  Continue her headache medications.  Return emergency department if worsening.  Reassurance that it is not uncommon for a headache to the last 3 days.  She is in agreement treatment plan.  Discharged stable condition   FINAL CLINICAL IMPRESSION(S) / ED DIAGNOSES   Final diagnoses:  Bad headache  Vertigo     Rx / DC Orders   ED Discharge Orders          Ordered    meclizine (ANTIVERT) 25 MG tablet  3 times daily PRN        08/10/23 2003             Note:  This document was prepared using Dragon voice recognition software and may include unintentional dictation errors.    Faythe Ghee, PA-C 08/10/23 2004    Janith Lima, MD 08/11/23 [phone]

## 2023-08-13 ENCOUNTER — Telehealth (INDEPENDENT_AMBULATORY_CARE_PROVIDER_SITE_OTHER): Payer: 59 | Admitting: Family

## 2023-08-13 ENCOUNTER — Encounter: Payer: Self-pay | Admitting: Family

## 2023-08-13 VITALS — Ht 61.0 in

## 2023-08-13 DIAGNOSIS — R519 Headache, unspecified: Secondary | ICD-10-CM

## 2023-08-13 DIAGNOSIS — R42 Dizziness and giddiness: Secondary | ICD-10-CM

## 2023-08-13 NOTE — Progress Notes (Signed)
MyChart Video Visit    Virtual Visit via Video Note   This format is felt to be most appropriate for this patient at this time. Physical exam was limited by quality of the video and audio technology used for the visit. CMA was able to get the patient set up on a video visit.  Patient location: Home. Patient and provider in visit Provider location: Office  I discussed the limitations of evaluation and management by telemedicine and the availability of in person appointments. The patient expressed understanding and agreed to proceed.  Visit Date: 08/13/2023  Today's healthcare provider: Dulce Sellar, NP     Subjective:   Patient ID: Angela Ramos, female    DOB: January 09, 1996, 27 y.o.   MRN: [id number]  Chief Complaint  Patient presents with   Migraine    Follow up from ED on 11/9, Sx include dizziness, migraine, Nausea and blurred vision. Has tried meclizine which did help slightly.   Discussed the use of AI scribe software for clinical note transcription with the patient, who gave verbal consent to proceed.  History of Present Illness   The patient, with a recent history of a migraine and was seen in the urgent care d/t the pain & her normal regimen of medication not working, she was given Butalbital but did not take as her pain resolved. But she then had to go to the ED 3 days ago due to ongoing dizziness and nausea after her migraine had started resolving. She had a head CT which was negative & she was given Zofran and meclizine which has helped a little. The dizziness is described as more of a lightheaded sensation vs. the room spinning. The patient has been on Effexor for about the last few mos, and there is a concern that this medication may be contributing to the headaches, though she was not having HA in the beginning.     Assessment & Plan:     Vertigo w/nausea- Persistent despite Meclizine. Possible triggers include stress, virus, trauma, or  migraine. -Continue Meclizine, consider reducing dose to half a pill twice daily for the next few days if drowsiness is a concern. -Continue Zofran as needed for nausea.  Acute headache - Unknown trigger. Pt w/hx of migraines, but never lasted more than a day & associated sx would also resolve fairly quickly. No history of frequent migraines. -Continue Butalbital as needed for migraines, remembering it may cause drowsiness. -Consider triggers and lifestyle modifications such as ensuring adequate hydration and regular meals to avoid future migraines. -Notify office if migraines continue, worsen, or become more frequent.     Past Medical History:  Diagnosis Date   Abscess    Asthma    Depression    Encounter for management and injection of injectable progestin contraceptive 06/01/2014   Endometriosis    GERD (gastroesophageal reflux disease)    Panic attacks    Routine screening for STI (sexually transmitted infection) 06/01/2014    Past Surgical History:  Procedure Laterality Date   Leg mass removed     MYRINGOTOMY WITH TUBE PLACEMENT     TURBINATE REDUCTION Bilateral 08/30/2017    Outpatient Medications Prior to Visit  Medication Sig Dispense Refill   albuterol (VENTOLIN HFA) 108 (90 Base) MCG/ACT inhaler Inhale 2 puffs into the lungs every 6 (six) hours as needed for wheezing or shortness of breath. 1 each 0   diclofenac Sodium (VOLTAREN) 1 % GEL Apply 4 g topically 4 (four) times daily as needed (for  pain). 50 g 2   loperamide (IMODIUM A-D) 2 MG tablet Take 1 tablet (2 mg total) by mouth 4 (four) times daily as needed for diarrhea or loose stools (Max dose 6 pills in 24 hours). 20 tablet 0   meclizine (ANTIVERT) 25 MG tablet Take 1 tablet (25 mg total) by mouth 3 (three) times daily as needed for dizziness. 30 tablet 0   ondansetron (ZOFRAN) 4 MG tablet Take 1 tablet (4 mg total) by mouth every 6 (six) hours. 6 tablet 0   pantoprazole (PROTONIX) 20 MG tablet TAKE 1 TABLET BY  MOUTH TWICE DAILY X 2 WEEKS THEN 1 TABLET EVERY MORNING 45 tablet 5   venlafaxine XR (EFFEXOR XR) 37.5 MG 24 hr capsule Take 1 capsule (37.5 mg total) by mouth daily with breakfast. 90 capsule 1   KURVELO 0.15-30 MG-MCG tablet Take 1 tablet by mouth daily. (Patient not taking: Reported on 06/14/2023) 28 tablet 0   No facility-administered medications prior to visit.    No Known Allergies     Objective:   Physical Exam Vitals and nursing note reviewed.  Constitutional:      General: Pt is not in acute distress.    Appearance: Normal appearance.  HENT:     Head: Normocephalic.  Pulmonary:     Effort: No respiratory distress.  Musculoskeletal:     Cervical back: Normal range of motion.  Skin:    General: Skin is dry.     Coloration: Skin is not pale.  Neurological:     Mental Status: Pt is alert and oriented to person, place, and time.  Psychiatric:        Mood and Affect: Mood normal.   Ht 5\' 1"  (1.549 m)   LMP 07/29/2023 (Approximate)   BMI 27.59 kg/m   Wt Readings from Last 3 Encounters:  06/14/23 146 lb (66.2 kg)  05/08/23 146 lb (66.2 kg)  03/07/23 142 lb 2 oz (64.5 kg)     I discussed the assessment and treatment plan with the patient. The patient was provided an opportunity to ask questions and all were answered. The patient agreed with the plan and demonstrated an understanding of the instructions.   The patient was advised to call back or seek an in-person evaluation if the symptoms worsen or if the condition fails to improve as anticipated.  Dulce Sellar, NP [facility] [place] [phone] (phone) [phone] (fax)  [facility]

## 2023-09-03 ENCOUNTER — Ambulatory Visit: Payer: 59 | Admitting: Licensed Clinical Social Worker

## 2023-09-17 ENCOUNTER — Ambulatory Visit: Payer: 59 | Admitting: Licensed Clinical Social Worker

## 2023-09-18 ENCOUNTER — Ambulatory Visit
Admission: EM | Admit: 2023-09-18 | Discharge: 2023-09-18 | Disposition: A | Discharge status: Home / Self Care | Payer: 59 | Attending: Emergency Medicine | Admitting: Emergency Medicine

## 2023-09-18 DIAGNOSIS — J069 Acute upper respiratory infection, unspecified: Secondary | ICD-10-CM

## 2023-09-18 DIAGNOSIS — J4521 Mild intermittent asthma with (acute) exacerbation: Secondary | ICD-10-CM

## 2023-09-18 DIAGNOSIS — Z3202 Encounter for pregnancy test, result negative: Secondary | ICD-10-CM

## 2023-09-18 LAB — POCT URINE PREGNANCY: Preg Test, Ur: NEGATIVE

## 2023-09-18 LAB — POC COVID19/FLU A&B COMBO
Covid Antigen, POC: NEGATIVE
Influenza A Antigen, POC: NEGATIVE
Influenza B Antigen, POC: NEGATIVE

## 2023-09-18 MED ORDER — PREDNISONE 10 MG PO TABS: 40.0000 mg | ORAL_TABLET | Freq: Every day | ORAL | 0 refills | Status: AC

## 2023-09-18 NOTE — Discharge Instructions (Addendum)
Take the prednisone as directed.  Use your albuterol inhaler as directed.    The COVID and flu tests are negative.   Take Tylenol or ibuprofen as needed for fever or discomfort.  Take plain Mucinex as needed for congestion.  Rest and keep yourself hydrated.    Follow-up with your primary care provider if your symptoms are not improving.

## 2023-09-18 NOTE — ED Provider Notes (Signed)
Angela Ramos    CSN: [id number] Arrival date & time: 09/18/23  1858      History   Chief Complaint Chief Complaint  Patient presents with   URI    HPI Angela Ramos is a 27 y.o. female.  Patient presents with 5-day history of runny nose, congestion, sneezing, sore throat, mild cough.  She has lost her sense of taste and smell.  She reports wheezing today but has not used her albuterol inhaler.  Treating symptoms with Mucinex and DayQuil.  No fever or shortness of breath.  Her medical history includes asthma.  The history is provided by the patient and medical records.    Past Medical History:  Diagnosis Date   Abscess    Asthma    Depression    Encounter for management and injection of injectable progestin contraceptive 06/01/2014   Endometriosis    GERD (gastroesophageal reflux disease)    Panic attacks    Routine screening for STI (sexually transmitted infection) 06/01/2014    Patient Active Problem List   Diagnosis Date Noted   Acute nonintractable headache 08/09/2023   Generalized anxiety disorder 09/25/2022   Left hip pain 09/25/2022   Mild intermittent asthma without complication 09/25/2022   Endometriosis 05/18/2022   Dysmenorrhea 06/01/2014    Past Surgical History:  Procedure Laterality Date   Leg mass removed     MYRINGOTOMY WITH TUBE PLACEMENT     TURBINATE REDUCTION Bilateral 08/30/2017    OB History   No obstetric history on file.      Home Medications    Prior to Admission medications   Medication Sig Start Date End Date Taking? Authorizing Provider  predniSONE (DELTASONE) 10 MG tablet Take 4 tablets (40 mg total) by mouth daily for 5 days. 09/18/23 09/23/23 Yes Mickie Bail, NP  albuterol (VENTOLIN HFA) 108 (90 Base) MCG/ACT inhaler Inhale 2 puffs into the lungs every 6 (six) hours as needed for wheezing or shortness of breath. 02/06/23 09/03/30  Dulce Sellar, NP  butalbital-acetaminophen-caffeine (FIORICET)  50-325-40 MG tablet Take 1 tablet by mouth 2 (two) times daily as needed for headache or migraine.    [provider]  diclofenac Sodium (VOLTAREN) 1 % GEL Apply 4 g topically 4 (four) times daily as needed (for pain). 09/25/22   Dulce Sellar, NP  loperamide (IMODIUM A-D) 2 MG tablet Take 1 tablet (2 mg total) by mouth 4 (four) times daily as needed for diarrhea or loose stools (Max dose 6 pills in 24 hours). 01/17/23   Dulce Sellar, NP  meclizine (ANTIVERT) 25 MG tablet Take 1 tablet (25 mg total) by mouth 3 (three) times daily as needed for dizziness. 08/10/23   Fisher, Roselyn Bering, PA-C  ondansetron (ZOFRAN) 4 MG tablet Take 1 tablet (4 mg total) by mouth every 6 (six) hours. 08/09/23   Defelice, Para March, NP  pantoprazole (PROTONIX) 20 MG tablet TAKE 1 TABLET BY MOUTH TWICE DAILY X 2 WEEKS THEN 1 TABLET EVERY MORNING 07/03/23   Dulce Sellar, NP  venlafaxine XR (EFFEXOR XR) 37.5 MG 24 hr capsule Take 1 capsule (37.5 mg total) by mouth daily with breakfast. 05/08/23   Dulce Sellar, NP    Family History Family History  Problem Relation Age of Onset   Diabetes Maternal Aunt    Hypertension Maternal Aunt    Hypertension Maternal Grandmother    Arthritis Maternal Grandmother     Social History Social History   Tobacco Use   Smoking status: Never   Smokeless  tobacco: Never  Vaping Use   Vaping status: Never Used  Substance Use Topics   Alcohol use: No   Drug use: No     Allergies   Patient has no known allergies.   Review of Systems Review of Systems  Constitutional:  Negative for chills and fever.  HENT:  Positive for congestion, rhinorrhea and sneezing. Negative for ear pain and sore throat.   Respiratory:  Positive for cough and wheezing. Negative for shortness of breath.   Cardiovascular:  Negative for chest pain and palpitations.     Physical Exam Triage Vital Signs ED Triage Vitals  Encounter Vitals Group     BP 09/18/23 1914 114/77      Systolic BP Percentile --      Diastolic BP Percentile --      Pulse Rate 09/18/23 1914 (!) 101     Resp 09/18/23 1914 18     Temp 09/18/23 1914 99.4 F (37.4 C)     Temp src --      SpO2 09/18/23 1914 97 %     Weight --      Height --      Head Circumference --      Peak Flow --      Pain Score 09/18/23 1911 4     Pain Loc --      Pain Education --      Exclude from Growth Chart --    No data found.  Updated Vital Signs BP 114/77   Pulse (!) 101   Temp 99.4 F (37.4 C)   Resp 18   LMP 08/22/2023   SpO2 97%   Visual Acuity Right Eye Distance:   Left Eye Distance:   Bilateral Distance:    Right Eye Near:   Left Eye Near:    Bilateral Near:     Physical Exam Constitutional:      General: She is not in acute distress. HENT:     Right Ear: Tympanic membrane normal.     Left Ear: Tympanic membrane normal.     Nose: Congestion and rhinorrhea present.     Mouth/Throat:     Mouth: Mucous membranes are moist.     Pharynx: Oropharynx is clear.  Cardiovascular:     Rate and Rhythm: Normal rate and regular rhythm.     Heart sounds: Normal heart sounds.  Pulmonary:     Effort: Pulmonary effort is normal. No respiratory distress.     Breath sounds: Normal breath sounds. No wheezing.  Skin:    General: Skin is warm and dry.  Neurological:     Mental Status: She is alert.      UC Treatments / Results  Labs (all labs ordered are listed, but only abnormal results are displayed) Labs Reviewed  POC COVID19/FLU A&B COMBO  POCT URINE PREGNANCY    EKG   Radiology No results found.  Procedures Procedures (including critical care time)  Medications Ordered in UC Medications - No data to display  Initial Impression / Assessment and Plan / UC Course  I have reviewed the triage vital signs and the nursing notes.  Pertinent labs & imaging results that were available during my care of the patient were reviewed by me and considered in my medical decision making  (see chart for details).    Asthma exacerbation, viral URI, negative pregnancy test.  Lungs are clear and O2 sat is 97% on room air.  No respiratory distress.  Rapid flu and COVID-negative.  Instructed  patient to start using her albuterol inhaler as directed.  Treating today with prednisone.  Discussed symptomatic management including Tylenol or ibuprofen as needed.  Mucinex as needed.  Instructed patient to follow-up with her PCP if she is not improving.  She agrees to plan of care.  Final Clinical Impressions(s) / UC Diagnoses   Final diagnoses:  Mild intermittent asthma with acute exacerbation  Viral URI  Negative pregnancy test     Discharge Instructions      Take the prednisone as directed.  Use your albuterol inhaler as directed.    The COVID and flu tests are negative.   Take Tylenol or ibuprofen as needed for fever or discomfort.  Take plain Mucinex as needed for congestion.  Rest and keep yourself hydrated.    Follow-up with your primary care provider if your symptoms are not improving.         ED Prescriptions     Medication Sig Dispense Auth. Provider   predniSONE (DELTASONE) 10 MG tablet Take 4 tablets (40 mg total) by mouth daily for 5 days. 20 tablet Mickie Bail, NP      PDMP not reviewed this encounter.   Mickie Bail, NP 09/18/23 [phone]

## 2023-09-18 NOTE — ED Triage Notes (Signed)
Patient to Urgent Care with complaints of sore throat/ nasal congestion/ sneezing/ runny nose/ loss of smell and taste.  Symptoms started Friday. Body aches started today along with loss of taste and smell.  Taking Mucinex and Dayquil.

## 2023-10-08 ENCOUNTER — Ambulatory Visit: Payer: 59 | Admitting: Licensed Clinical Social Worker

## 2023-10-15 ENCOUNTER — Ambulatory Visit: Payer: 59 | Admitting: Licensed Clinical Social Worker

## 2023-10-23 ENCOUNTER — Ambulatory Visit (INDEPENDENT_AMBULATORY_CARE_PROVIDER_SITE_OTHER): Payer: 59 | Admitting: Licensed Clinical Social Worker

## 2023-10-23 DIAGNOSIS — F419 Anxiety disorder, unspecified: Secondary | ICD-10-CM | POA: Diagnosis not present

## 2023-10-23 DIAGNOSIS — F331 Major depressive disorder, recurrent, moderate: Secondary | ICD-10-CM | POA: Diagnosis not present

## 2023-10-23 NOTE — Progress Notes (Signed)
Comprehensive Clinical Assessment (CCA) Note  10/23/2023 Angela Ramos [id number]  Time Spent: 11:00  am - 12:03 pm: 63 Minutes  Chief Complaint: No chief complaint on file.  Visit Diagnosis: Anxiety and Depression    Guardian/Payee:  Self/Adult    Paperwork requested: No   Reason for Visit /Presenting Problem: Dealing with anxiety and depression that first onset in 2016, sleepy with new meds Effexor   Mental Status Exam: Appearance:   Neat     Behavior:  Appropriate  Motor:  Normal  Speech/Language:   Clear and Coherent  Affect:  Appropriate  Mood:  normal  Thought process:  normal  Thought content:    WNL  Sensory/Perceptual disturbances:    WNL  Orientation:  oriented to person, place, and time/date  Attention:  Good  Concentration:  Good  Memory:  WNL  Fund of knowledge:   Good  Insight:    Good  Judgment:   Good  Impulse Control:  Good   Reported Symptoms:  Has felt afraid to live her life, not putting herself out there like she should, has been dealing with anxiety and depression since the Pandemic, withdrawn from connections with family and friends, feels like she is still in "quarantine" mood.  Loss mom from an aneurysm so she can fixate on health and death.     Risk Assessment: Danger to Self:  No Self-injurious Behavior: No Danger to Others: No Duty to Warn:no Physical Aggression / Violence:No  Access to Firearms a concern: No  Gang Involvement:No  Patient / guardian was educated about steps to take if suicide or homicide risk level increases between visits: yes While future psychiatric events cannot be accurately predicted, the patient does not currently require acute inpatient psychiatric care and does not currently meet [facility] involuntary commitment criteria.  Substance Abuse History: Current substance abuse: No     Caffeine: Tobacco: Alcohol: Substance use:  Past Psychiatric History:   Previous psychological history is  significant for anxiety and depression Outpatient Providers:[facility] History of Psych Hospitalization: No  Psychological Testing:  None    Abuse History:  Victim of: No.,  None    Report needed: No. Victim of Neglect:No. Perpetrator of  None   Witness / Exposure to Domestic Violence: No   Protective Services Involvement: No  Witness to [employer] Violence:  No   Family History:  Family History  Problem Relation Age of Onset   Diabetes Maternal Aunt    Hypertension Maternal Aunt    Hypertension Maternal Grandmother    Arthritis Maternal Grandmother     Living situation: the patient lives with their partner  Sexual Orientation: Straight  Relationship Status: co-habitating  Name of spouse / other: Justice If a parent, number of children / ages:None  Support Systems: significant other  Financial Stress:  No   Income/Employment/Disability: Employment  Financial planner: No   Educational History: Education: high school diploma/GED  Religion/Sprituality/World View: None  Any cultural differences that may affect / interfere with treatment:  None  Recreation/Hobbies: Self care  Stressors: Educational concerns    Strengths: Supportive Relationships  Barriers:  none   Legal History: Pending legal issue / charges: The patient has no significant history of legal issues. History of legal issue / charges:  None  Medical History/Surgical History: not reviewed Past Medical History:  Diagnosis Date   Abscess    Asthma    Depression    Encounter for management and injection of injectable progestin contraceptive 06/01/2014   Endometriosis  GERD (gastroesophageal reflux disease)    Panic attacks    Routine screening for STI (sexually transmitted infection) 06/01/2014    Past Surgical History:  Procedure Laterality Date   Leg mass removed     MYRINGOTOMY WITH TUBE PLACEMENT     TURBINATE REDUCTION Bilateral 08/30/2017    Medications: Current Outpatient Medications   Medication Sig Dispense Refill   albuterol (VENTOLIN HFA) 108 (90 Base) MCG/ACT inhaler Inhale 2 puffs into the lungs every 6 (six) hours as needed for wheezing or shortness of breath. 1 each 0   butalbital-acetaminophen-caffeine (FIORICET) 50-325-40 MG tablet Take 1 tablet by mouth 2 (two) times daily as needed for headache or migraine.     diclofenac Sodium (VOLTAREN) 1 % GEL Apply 4 g topically 4 (four) times daily as needed (for pain). 50 g 2   loperamide (IMODIUM A-D) 2 MG tablet Take 1 tablet (2 mg total) by mouth 4 (four) times daily as needed for diarrhea or loose stools (Max dose 6 pills in 24 hours). 20 tablet 0   meclizine (ANTIVERT) 25 MG tablet Take 1 tablet (25 mg total) by mouth 3 (three) times daily as needed for dizziness. 30 tablet 0   ondansetron (ZOFRAN) 4 MG tablet Take 1 tablet (4 mg total) by mouth every 6 (six) hours. 6 tablet 0   pantoprazole (PROTONIX) 20 MG tablet TAKE 1 TABLET BY MOUTH TWICE DAILY X 2 WEEKS THEN 1 TABLET EVERY MORNING 45 tablet 5   venlafaxine XR (EFFEXOR XR) 37.5 MG 24 hr capsule Take 1 capsule (37.5 mg total) by mouth daily with breakfast. 90 capsule 1   No current facility-administered medications for this visit.    No Known Allergies  Diagnoses:  Anxiety and MDD recurrent moderate  Psychiatric Treatment: No , None  Plan of Care: Virtual  Narrative:   Angela Ramos participated from home, via video, is aware of tele-sessions limitations, and consented to treatment. Therapist participated from home office. We reviewed the limits of confidentiality prior to the start of the evaluation. Angela Ramos expressed understanding and agreement to proceed.   A follow-up was scheduled to create a treatment plan and begin treatment. Therapist answered all questions during the evaluation and contact information was provided.     Anselmo Pickler, [facility]

## 2023-10-31 ENCOUNTER — Ambulatory Visit: Payer: 59 | Admitting: Licensed Clinical Social Worker

## 2023-10-31 DIAGNOSIS — F419 Anxiety disorder, unspecified: Secondary | ICD-10-CM

## 2023-10-31 DIAGNOSIS — F331 Major depressive disorder, recurrent, moderate: Secondary | ICD-10-CM | POA: Diagnosis not present

## 2023-10-31 NOTE — Progress Notes (Signed)
[facility] Counselor/Therapist Progress Note  Patient ID: SEAIRA BYUS, MRN: [id number]   Date: 10/31/23  Time Spent: 2:03  pm - 3:01 pm : 58 Minutes  Treatment Type: Individual Therapy.  Reported Symptoms: feelings of insecurity, requiring validation from others  Mental Status Exam: Appearance:  Neat     Behavior: Appropriate  Motor: Normal  Speech/Language:  Clear and Coherent  Affect: Appropriate  Mood: normal  Thought process: normal  Thought content:   WNL  Sensory/Perceptual disturbances:   WNL  Orientation: oriented to person, place, and time/date  Attention: Good  Concentration: Good  Memory: WNL  Fund of knowledge:  Good  Insight:   Good  Judgment:  Good  Impulse Control: Good   Risk Assessment: Danger to Self:  No Self-injurious Behavior: No Danger to Others: No Duty to Warn:no Physical Aggression / Violence:No  Access to Firearms a concern: No  Gang Involvement:No   Subjective:   Katherin C Rankin-Jordan participated from office, via video and consented to treatment. Therapist participated from home office. I discussed the limitations of evaluation and management by telemedicine and the availability of in person appointments. The patient expressed understanding and agreed to proceed. Nayda reviewed the events of the past week.      We reviewed numerous treatment approaches including CBT and Solution focused therapy. Psych-education regarding the Brindley's diagnosis of Major depressive disorder, recurrent episode, moderate (HCC) was provided during the session. We discussed Reinette C Rankin-Jordan's goals treatment goals which include  identifying triggers, fears and concerns that bring on social anxiety and feeling of inadequacy when around other people (large groups and work environments).   Hermelinda Medicus Rankin-Jordan provided verbal approval of the treatment plan.   Interventions: Psycho-education & Goal Setting.   Diagnosis:   Major depressive disorder, recurrent episode, moderate (HCC)  Psychiatric Treatment: No , N/A   Treatment Plan:  Client Abilities/Strengths Aarilyn open to change.Excited about accomplishing and mastering this goal because it will lead to other things that she can do.    Support System: Boyfriend, friends and family  Client Treatment Preferences Virtual  Treatment Level Weekly  Symptoms  Social Anxiety   (Status: maintained) Excited   (Status: improved)  Goals:   Doralee agreed to set goal of  identifying triggers, fears and concerns that bring on social anxiety and feeling of inadequacy when around other people (large groups and work environments).    Treatment plan signed and available on s-drive:  Yes    Target Date: 12/12/23 Frequency: Weekly  Progress: 0 Modality: individual    Therapist will provide referrals for additional resources as appropriate.  Therapist will provide psycho-education regarding Maddyson's diagnosis and corresponding treatment approaches and interventions. Licensed Clinical Mental Health Counselor, Anselmo Pickler, [facility] will support the patient's ability to achieve the goals identified. will employ CBT, BA, Problem-solving, Solution Focused, Mindfulness,  coping skills, & other evidenced-based practices will be used to promote progress towards healthy functioning to help manage decrease symptoms associated with her diagnosis.   Reduce overall level, frequency, and intensity of the feelings of depression, anxiety and panic evidenced by decreased  from 6 to 7 days/week to 0 to 1 days/week per client report for at least 3 consecutive months. Verbally express understanding of the relationship between feelings of depression, anxiety and their impact on thinking patterns and behaviors. Verbalize an understanding of the role that distorted thinking plays in creating fears, excessive worry, and ruminations.    (Ingri participated in the creation of  the  treatment plan)    Anselmo Pickler, [facility]

## 2023-11-07 ENCOUNTER — Ambulatory Visit: Payer: 59 | Admitting: Licensed Clinical Social Worker

## 2023-11-07 DIAGNOSIS — F331 Major depressive disorder, recurrent, moderate: Secondary | ICD-10-CM | POA: Diagnosis not present

## 2023-11-07 DIAGNOSIS — F419 Anxiety disorder, unspecified: Secondary | ICD-10-CM | POA: Diagnosis not present

## 2023-11-07 NOTE — Progress Notes (Signed)
   [facility] Counselor/Therapist Progress Note  Patient ID: Angela Ramos, MRN: [id number]    Date: 11/07/23  Time Spent: 12:05  pm - 12:58 pm : 53 Minutes  Treatment Type: Individual Therapy.  Reported Symptoms: feeling open and optimistic   Mental Status Exam: Appearance:  Well Groomed     Behavior: Appropriate and Sharing  Motor: Normal  Speech/Language:  Normal Rate  Affect: Appropriate  Mood: normal  Thought process: normal  Thought content:   WNL  Sensory/Perceptual disturbances:   WNL  Orientation: oriented to person, place, and time/date  Attention: Good  Concentration: Good  Memory: WNL  Fund of knowledge:  Good  Insight:   Good  Judgment:  Good  Impulse Control: Good   Risk Assessment: Danger to Self:  No Self-injurious Behavior: No Danger to Others: No Duty to Warn:no Physical Aggression / Violence:No  Access to Firearms a concern: No  Gang Involvement:No   Subjective:   Angela Ramos participated from home, via video, and consented to treatment. I discussed the limitations of evaluation and management by telemedicine and the availability of in person appointments. The patient expressed understanding and agreed to proceed.  Therapist participated from home office.  Angela Ramos reviewed the events of the past week.      Interventions: Cognitive Behavioral Therapy and Psycho-education/Bibliotherapy  Diagnosis:  Major depressive disorder, recurrent episode, moderate (HCC)  Anxiety  Psychiatric Treatment: No , N/A  Treatment Plan:  Client Abilities/Strengths Angela Ramos is self aware and open to sessions that promote growth and development.    Support System: Boyfriend, family, friends, sessions  Client Treatment Preferences Virtual  Client Statement of Needs Angela Ramos would like to continue to learn ways to manage and identify triggers.     Treatment Level Weekly  Symptoms  Excited   (Status: improved) Interested    (Status: maintained)  Goals:   Angela Ramos experiences symptoms of being more excited and interested about things she is navigating her life.  More feelings of control since she stopped having panic attacks as she did in 2016.     Target Date: 12/12/23 Frequency: Weekly  Progress: 0 Modality: individual    Therapist will provide referrals for additional resources as appropriate.  Therapist will provide psycho-education regarding Angela Ramos diagnosis and corresponding treatment approaches and interventions. Licensed Clinical Mental Health Counselor, Angela Ramos, [facility] will support the patient's ability to achieve the goals identified. will employ CBT, BA, Problem-solving, Solution Focused, Mindfulness,  coping skills, & other evidenced-based practices will be used to promote progress towards healthy functioning to help manage decrease symptoms associated with her diagnosis.   Reduce overall level, frequency, and intensity of the feelings of depression, anxiety and panic evidenced by decreased   Verbally express understanding of the relationship between feelings of depression, anxiety and their impact on thinking patterns and behaviors. Verbalize an understanding of the role that distorted thinking plays in creating fears, excessive worry, and ruminations.  ([place] participated in the creation of the treatment plan)   Angela Ramos, LCMHC

## 2023-11-21 ENCOUNTER — Ambulatory Visit: Payer: 59 | Admitting: Licensed Clinical Social Worker

## 2023-11-21 DIAGNOSIS — F329 Major depressive disorder, single episode, unspecified: Secondary | ICD-10-CM | POA: Diagnosis not present

## 2023-11-21 DIAGNOSIS — F419 Anxiety disorder, unspecified: Secondary | ICD-10-CM

## 2023-11-21 DIAGNOSIS — F331 Major depressive disorder, recurrent, moderate: Secondary | ICD-10-CM

## 2023-11-21 NOTE — Progress Notes (Signed)
[facility] Counselor/Therapist Progress Note  Patient ID: Angela Ramos, MRN: [id number]    Date: 11/21/23  Time Spent: 12:03  pm - 12:59 pm : 56 Minutes  Treatment Type: Individual Therapy.  Reported Symptoms: feeling disappointed and hopeful  Mental Status Exam: Appearance:  Well Groomed     Behavior: Sharing  Motor: Normal  Speech/Language:  Normal Rate  Affect: Appropriate  Mood: normal  Thought process: normal  Thought content:   WNL  Sensory/Perceptual disturbances:   WNL  Orientation: oriented to person, place, and time/date  Attention: Good  Concentration: Good  Memory: WNL  Fund of knowledge:  Good  Insight:   Good  Judgment:  Good  Impulse Control: Good   Risk Assessment: Danger to Self:  No Self-injurious Behavior: No Danger to Others: No Duty to Warn:no Physical Aggression / Violence:No  Access to Firearms a concern: No  Gang Involvement:No   Subjective:   Bannie C Rankin-Jordan participated from home, via video, and consented to treatment. I discussed the limitations of evaluation and management by telemedicine and the availability of in person appointments. The patient expressed understanding and agreed to proceed.  Therapist participated from home office.  Takelia reviewed the events of the past week.      Interventions: Motivational Interviewing and Insight-Oriented  Diagnosis:  MDD, Anxiety  Psychiatric Treatment: No , N/A  Treatment Plan:  Client Abilities/Strengths Bradie is self aware and open to sessions that promote growth and development.    Support System: Family and Friends  Merchant navy officer of Needs Levetta would like to would like to continue to learn ways to manage and identify triggers.   Treatment Level Weekly  Symptoms  Disappointed   (Status: declined) Hopeful   (Status: improved)  Goals:   Lydia experiences symptoms of identifying triggers, fears  and concerns that bring on social anxiety and feeling of inadequacy when around other people (large groups and work environments).  Sessions paused due to patient needing to confirm insurance fees/coverage.       Target Date: 12/12/23 Frequency: Weekly  Progress: 0 Modality: individual    Therapist will provide referrals for additional resources as appropriate.  Therapist will provide psycho-education regarding Neilah's diagnosis and corresponding treatment approaches and interventions. Licensed Clinical Mental Health Counselor, Anselmo Pickler, [facility] will support the patient's ability to achieve the goals identified. will employ CBT, BA, Problem-solving, Solution Focused, Mindfulness,  coping skills, & other evidenced-based practices will be used to promote progress towards healthy functioning to help manage decrease symptoms associated with her diagnosis.   Reduce overall level, frequency, and intensity of the feelings of depression, anxiety and panic evidenced by increased mental awareness Dally express understanding of the relationship between feelings of depression, anxiety and their impact on thinking patterns and behaviors. Verbalize an understanding of the role that distorted thinking plays in creating fears, excessive worry, and ruminations.  ([place] participated in the creation of the treatment plan)   Anselmo Pickler, [facility]

## 2023-12-03 ENCOUNTER — Ambulatory Visit (INDEPENDENT_AMBULATORY_CARE_PROVIDER_SITE_OTHER): Admitting: Family

## 2023-12-03 VITALS — BP 140/84 | HR 87 | Temp 98.1°F | Ht 61.0 in | Wt 157.4 lb

## 2023-12-03 DIAGNOSIS — R002 Palpitations: Secondary | ICD-10-CM

## 2023-12-03 DIAGNOSIS — F411 Generalized anxiety disorder: Secondary | ICD-10-CM | POA: Diagnosis not present

## 2023-12-03 MED ORDER — VENLAFAXINE HCL ER 37.5 MG PO CP24: 37.5000 mg | ORAL_CAPSULE | Freq: Every day | ORAL | Status: DC

## 2023-12-03 NOTE — Progress Notes (Addendum)
 Patient ID: Angela Ramos, female    DOB: 10-Sep-1996, 28 y.o.   MRN: [id number]  Chief Complaint  Patient presents with   Palpitations    Pt states having heart palpitations for past week and half every day feels like heart fluttering making her cough as well.       Discussed the use of AI scribe software for clinical note transcription with the patient, who gave verbal consent to proceed.  History of Present Illness   Angela Ramos is a 28 year old female who presents with heart palpitations. She has been experiencing heart palpitations randomly over the past week to 10d, occurring both during the day at work and at night at home. She describes an 'odd cough' associated with the palpitations. No concurrent indigestion or heartburn during these episodes. She denies caffeine intake, including coffee, tea, and sodas, and mentions that her tea is caffeine-free. She acknowledges experiencing increased stress over the past week and a half, which she attributes to various life events. She is currently seeing a therapist to help manage her stress and anxiety. She is on Effexor for anxiety and depression, which has helped with her symptoms, although she still experiences some drowsiness during the day. She recently refilled her prescription. In terms of family history, her family members are typically on high blood pressure medication and have heart disease, but the age of onset is unknown. No family members have died suddenly at a young age.     Assessment & Plan:     Palpitations - New onset palpitations, possibly related to stress and anxiety. Heart NSR, no extra beats or arrhythmia detected. No associated chest pain, nausea, dizziness or indigestion. Pt is low risk for cardiac etiology. No caffeine intake. Family history of hypertension and heart disease, but no sudden cardiac deaths or conditions at a young age. -Increase Effexor to two 37.5mg  capsules daily for 2-3 weeks to manage  anxiety, potential stress-related palpitations, and decrease drowsiness, and let me know if helping and will send increased dose. -Continue therapy sessions. -F/U in 3 mos in office   Anxiety - Pt taking Effexor 37.5mg  qd and seeing therapist regularly. Having more anxiety/stress over the last 1-2w with new palpitations noted,  Pt also reports having some drowsiness after taking med in the am. Discussed increasing dose vs switching meds. -Increase dose to 2 capules = 75mg   qam to offset drowsiness and help worsened anxiety sx. -If still drowsy, consider switching to qhs.  Subjective:    Outpatient Medications Prior to Visit  Medication Sig Dispense Refill   albuterol (VENTOLIN HFA) 108 (90 Base) MCG/ACT inhaler Inhale 2 puffs into the lungs every 6 (six) hours as needed for wheezing or shortness of breath. 1 each 0   butalbital-acetaminophen-caffeine (FIORICET) 50-325-40 MG tablet Take 1 tablet by mouth 2 (two) times daily as needed for headache or migraine.     diclofenac Sodium (VOLTAREN) 1 % GEL Apply 4 g topically 4 (four) times daily as needed (for pain). 50 g 2   pantoprazole (PROTONIX) 20 MG tablet TAKE 1 TABLET BY MOUTH TWICE DAILY X 2 WEEKS THEN 1 TABLET EVERY MORNING 45 tablet 5   venlafaxine XR (EFFEXOR XR) 37.5 MG 24 hr capsule Take 1 capsule (37.5 mg total) by mouth daily with breakfast. 90 capsule 1   loperamide (IMODIUM A-D) 2 MG tablet Take 1 tablet (2 mg total) by mouth 4 (four) times daily as needed for diarrhea or loose stools (Max dose 6 pills  in 24 hours). (Patient not taking: Reported on 12/03/2023) 20 tablet 0   meclizine (ANTIVERT) 25 MG tablet Take 1 tablet (25 mg total) by mouth 3 (three) times daily as needed for dizziness. (Patient not taking: Reported on 12/03/2023) 30 tablet 0   ondansetron (ZOFRAN) 4 MG tablet Take 1 tablet (4 mg total) by mouth every 6 (six) hours. (Patient not taking: Reported on 12/03/2023) 6 tablet 0   No facility-administered medications prior to  visit.   Past Medical History:  Diagnosis Date   Abscess    Asthma    Depression    Encounter for management and injection of injectable progestin contraceptive 06/01/2014   Endometriosis    GERD (gastroesophageal reflux disease)    Panic attacks    Routine screening for STI (sexually transmitted infection) 06/01/2014   Past Surgical History:  Procedure Laterality Date   Leg mass removed     MYRINGOTOMY WITH TUBE PLACEMENT     TURBINATE REDUCTION Bilateral 08/30/2017   No Known Allergies    Objective:    Physical Exam Vitals and nursing note reviewed.  Constitutional:      Appearance: Normal appearance.  Cardiovascular:     Rate and Rhythm: Normal rate and regular rhythm.  Pulmonary:     Effort: Pulmonary effort is normal.     Breath sounds: Normal breath sounds.  Musculoskeletal:        General: Normal range of motion.  Skin:    General: Skin is warm and dry.  Neurological:     Mental Status: She is alert.  Psychiatric:        Mood and Affect: Mood normal.        Behavior: Behavior normal.    BP (!) 140/84   Pulse 87   Temp 98.1 F (36.7 C) (Temporal)   Ht 5\' 1"  (1.549 m)   Wt 157 lb 6.4 oz (71.4 kg)   SpO2 98%   BMI 29.74 kg/m  Wt Readings from Last 3 Encounters:  12/03/23 157 lb 6.4 oz (71.4 kg)  06/14/23 146 lb (66.2 kg)  05/08/23 146 lb (66.2 kg)       Dulce Sellar, NP

## 2023-12-03 NOTE — Assessment & Plan Note (Signed)
 Pt taking Effexor 37.5mg  qd and seeing therapist regularly. Having more anxiety/stress over the last 1-2w,  with new palpitations noted. Pt also reports having some drowsiness after taking med in the am. Discussed increasing dose vs switching meds. -Increase dose to 2 capules = 75mg   qam to offset drowsiness and help worsened anxiety sx. -If still drowsy, consider switching to qhs.

## 2024-01-09 ENCOUNTER — Other Ambulatory Visit: Payer: Self-pay | Admitting: Family

## 2024-01-09 DIAGNOSIS — F411 Generalized anxiety disorder: Secondary | ICD-10-CM

## 2024-01-10 ENCOUNTER — Other Ambulatory Visit: Payer: Self-pay | Admitting: Family

## 2024-01-10 DIAGNOSIS — K219 Gastro-esophageal reflux disease without esophagitis: Secondary | ICD-10-CM

## 2024-01-10 MED ORDER — PANTOPRAZOLE SODIUM 20 MG PO TBEC: 20.0000 mg | DELAYED_RELEASE_TABLET | Freq: Every day | ORAL | 5 refills | Status: AC

## 2024-01-10 NOTE — Telephone Encounter (Signed)
 Copied from CRM [id number]. Topic: Clinical - Medication Refill >> Jan 10, 2024  1:21 PM Gurney Maxin H wrote: Most Recent Primary Care Visit:  Provider: Dulce Sellar  Department: [facility]  Visit Type: OFFICE VISIT  Date: 12/03/2023  Medication: pantoprazole (PROTONIX) 20 MG tablet  Has the patient contacted their pharmacy? Yes, waiting on provider (Agent: If no, request that the patient contact the pharmacy for the refill. If patient does not wish to contact the pharmacy document the reason why and proceed with request.) (Agent: If yes, when and what did the pharmacy advise?)  Is this the correct pharmacy for this prescription? Yes If no, delete pharmacy and type the correct one.  This is the patient's preferred pharmacy:  [id number] - [place], [place] - [address] AT [facility] OF [address] [facility] [address] [place] [address] Phone: [phone] Fax: [phone]   Has the prescription been filled recently? No  Is the patient out of the medication? Yes  Has the patient been seen for an appointment in the last year OR does the patient have an upcoming appointment? Yes  Can we respond through MyChart? Yes  Agent: Please be advised that Rx refills may take up to 3 business days. We ask that you follow-up with your pharmacy.

## 2024-02-11 ENCOUNTER — Emergency Department
Admission: EM | Admit: 2024-02-11 | Discharge: 2024-02-11 | Disposition: A | Discharge status: Home / Self Care | Payer: Self-pay | Attending: Emergency Medicine | Admitting: Emergency Medicine

## 2024-02-11 ENCOUNTER — Emergency Department: Payer: Self-pay

## 2024-02-11 ENCOUNTER — Other Ambulatory Visit: Payer: Self-pay

## 2024-02-11 DIAGNOSIS — N83201 Unspecified ovarian cyst, right side: Secondary | ICD-10-CM | POA: Insufficient documentation

## 2024-02-11 DIAGNOSIS — N83202 Unspecified ovarian cyst, left side: Secondary | ICD-10-CM | POA: Insufficient documentation

## 2024-02-11 LAB — URINALYSIS, ROUTINE W REFLEX MICROSCOPIC
Bilirubin Urine: NEGATIVE
Glucose, UA: >=500 mg/dL — AB
Hgb urine dipstick: NEGATIVE
Ketones, ur: NEGATIVE mg/dL
Leukocytes,Ua: NEGATIVE
Nitrite: NEGATIVE
Protein, ur: NEGATIVE mg/dL
Specific Gravity, Urine: 1.015 (ref 1.005–1.030)
pH: 6 (ref 5.0–8.0)

## 2024-02-11 LAB — CBC
HCT: 42 % (ref 36.0–46.0)
Hemoglobin: 14 g/dL (ref 12.0–15.0)
MCH: 27.8 pg (ref 26.0–34.0)
MCHC: 33.3 g/dL (ref 30.0–36.0)
MCV: 83.5 fL (ref 80.0–100.0)
Platelets: 306 10*3/uL (ref 150–400)
RBC: 5.03 MIL/uL (ref 3.87–5.11)
RDW: 13.3 % (ref 11.5–15.5)
WBC: 7.4 10*3/uL (ref 4.0–10.5)
nRBC: 0 % (ref 0.0–0.2)

## 2024-02-11 LAB — BASIC METABOLIC PANEL WITH GFR
Anion gap: 5 (ref 5–15)
BUN: 9 mg/dL (ref 6–20)
CO2: 25 mmol/L (ref 22–32)
Calcium: 9.2 mg/dL (ref 8.9–10.3)
Chloride: 111 mmol/L (ref 98–111)
Creatinine, Ser: 0.95 mg/dL (ref 0.44–1.00)
GFR, Estimated: >60 mL/min (ref >60)
Glucose, Bld: 106 mg/dL — ABNORMAL HIGH (ref 70–99)
Potassium: 3.6 mmol/L (ref 3.5–5.1)
Sodium: 141 mmol/L (ref 135–145)

## 2024-02-11 LAB — POC URINE PREG, ED: Preg Test, Ur: NEGATIVE

## 2024-02-11 MED ORDER — SODIUM CHLORIDE 0.9 % IV BOLUS
1000.0000 mL | Freq: Once | INTRAVENOUS | Status: AC
  Administered 2024-02-11: 1000 mL via INTRAVENOUS

## 2024-02-11 MED ORDER — KETOROLAC TROMETHAMINE 15 MG/ML IJ SOLN
15.0000 mg | Freq: Once | INTRAMUSCULAR | Status: AC
  Administered 2024-02-11: 15 mg via INTRAVENOUS
  Filled 2024-02-11: qty 1

## 2024-02-11 NOTE — ED Triage Notes (Signed)
 Arrives with C/O lower back pain x several weeks.  Also c/o body aches.  Denies dysuria, states some urinary frequency.

## 2024-02-11 NOTE — ED Provider Notes (Signed)
 [facility] Provider Note    Event Date/Time   First MD Initiated Contact with Patient 02/11/24 1103     (approximate)   History   No chief complaint on file.   HPI  Angela Ramos is a 28 y.o. female with a past medical history of endometriosis who presents today for evaluation of right sided flank pain that has been present for approximately 2 weeks.  She reports that sometimes it is more noticeable than others.  She denies dysuria or vaginal discharge.  She has not had any vaginal bleeding.  No history of kidney stones.  No fevers or chills.  Patient Active Problem List   Diagnosis Date Noted   Acute nonintractable headache 08/09/2023   Generalized anxiety disorder 09/25/2022   Left hip pain 09/25/2022   Mild intermittent asthma without complication 09/25/2022   Endometriosis 05/18/2022   Dysmenorrhea 06/01/2014          Physical Exam   Triage Vital Signs: ED Triage Vitals  Encounter Vitals Group     BP 02/11/24 1049 115/83     Systolic BP Percentile --      Diastolic BP Percentile --      Pulse Rate 02/11/24 1049 (!) 124     Resp 02/11/24 1049 16     Temp 02/11/24 1049 98.5 F (36.9 C)     Temp Source 02/11/24 1049 Oral     SpO2 02/11/24 1049 100 %     Weight 02/11/24 1048 157 lb 6.5 oz (71.4 kg)     Height --      Head Circumference --      Peak Flow --      Pain Score 02/11/24 1047 7     Pain Loc --      Pain Education --      Exclude from Growth Chart --     Most recent vital signs: Vitals:   02/11/24 1215 02/11/24 1230  BP: 112/72 116/81  Pulse: 88 84  Resp: 18 20  Temp:    SpO2: 100% 99%    Physical Exam Vitals and nursing note reviewed.  Constitutional:      General: Awake and alert. No acute distress.    Appearance: Normal appearance. The patient is normal weight.  HENT:     Head: Normocephalic and atraumatic.     Mouth: Mucous membranes are moist.  Eyes:     General: PERRL. Normal EOMs         Right eye: No discharge.        Left eye: No discharge.     Conjunctiva/sclera: Conjunctivae normal.  Cardiovascular:     Rate and Rhythm: Normal rate and regular rhythm.     Pulses: Normal pulses.     Heart sounds: Normal heart sounds Pulmonary:     Effort: Pulmonary effort is normal. No respiratory distress.     Breath sounds: Normal breath sounds.  Abdominal:     Abdomen is soft. There is mild bilateral lower abdominal tenderness. No rebound or guarding. No distention. Musculoskeletal:        General: No swelling. Normal range of motion.     Cervical back: Normal range of motion and neck supple.  Skin:    General: Skin is warm and dry.     Capillary Refill: Capillary refill takes less than 2 seconds.     Findings: No rash.  Neurological:     Mental Status: The patient is awake and alert.  ED Results / Procedures / Treatments   Labs (all labs ordered are listed, but only abnormal results are displayed) Labs Reviewed  URINALYSIS, ROUTINE W REFLEX MICROSCOPIC - Abnormal; Notable for the following components:      Result Value   Color, Urine YELLOW (*)    APPearance CLEAR (*)    Glucose, UA >=500 (*)    Bacteria, UA RARE (*)    All other components within normal limits  BASIC METABOLIC PANEL WITH GFR - Abnormal; Notable for the following components:   Glucose, Bld 106 (*)    All other components within normal limits  CBC  POC URINE PREG, ED     EKG     RADIOLOGY I independently reviewed and interpreted imaging and agree with radiologists findings.     PROCEDURES:  Critical Care performed:   Procedures   MEDICATIONS ORDERED IN ED: Medications  sodium chloride  0.9 % bolus 1,000 mL (0 mLs Intravenous Stopped 02/11/24 1300)  ketorolac  (TORADOL ) 15 MG/ML injection 15 mg (15 mg Intravenous Given 02/11/24 1207)     IMPRESSION / MDM / ASSESSMENT AND PLAN / ED COURSE  I reviewed the triage vital signs and the nursing notes.   Differential diagnosis  includes, but is not limited to, urinary tract infection, pyelonephritis, ovarian cyst, appendicitis, nephrolithiasis.  Patient is awake and alert, nontoxic in appearance.  She was tachycardic on arrival was given a liter of normal saline with complete resolution of her tachycardia.  Labs obtained in triage are overall quite reassuring.  Urinalysis obtained does not reveal evidence of infection.  Pregnancy is negative.  CAT scan obtained for evaluation of renal stone is negative for nephrolithiasis or ureteral stone but does reveal bilateral complex ovarian cysts that appear to be unchanged from November 2022.  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  This may be causing some of her discomfort.  Her exam is not consistent with torsion, she feels that her pain is minimal.  She does not appear to be intermittently or incompletely torsing.  We did discuss close outpatient follow-up with OB/GYN, as well as strict return precautions.  Patient is in agreement with this plan.  Her pain had resolved after Toradol .   Patient's presentation is most consistent with acute presentation with potential threat to life or bodily function.   Clinical Course as of 02/11/24 1746  Tue Feb 11, 2024  1258 Upon reevaluation, patient reports that pain has resolved and she feels ready for discharge [JP]    Clinical Course User Index [JP] Brooksie Ellwanger E, PA-C     FINAL CLINICAL IMPRESSION(S) / ED DIAGNOSES   Final diagnoses:  Cysts of both ovaries     Rx / DC Orders   ED Discharge Orders     None        Note:  This document was prepared using Dragon voice recognition software and may include unintentional dictation errors.   Dustee Bottenfield E, PA-C 02/11/24 1747    Jacquie Maudlin, MD 02/13/24 [phone]

## 2024-02-11 NOTE — Discharge Instructions (Signed)
 Please follow-up with your OB/GYN.  Please return for any new, worsening, or change in symptoms or other concerns.  It was a pleasure caring for you today.

## 2024-02-11 NOTE — ED Notes (Signed)
 Pt requested to talk to provider again before leaving. Provider at bedside.

## 2024-02-11 NOTE — ED Notes (Addendum)
 Dull aching bilateral mid-lower back pain since last few weeks. Denies fevers, denies dysuria. Had CT already.

## 2024-02-14 ENCOUNTER — Ambulatory Visit: Payer: Self-pay | Admitting: Family

## 2024-02-14 ENCOUNTER — Telehealth: Payer: Self-pay | Admitting: Family

## 2024-02-14 NOTE — Progress Notes (Deleted)
   Patient ID: Angela Ramos, female    DOB: Apr 03, 1996, 28 y.o.   MRN: [id number]  No chief complaint on file.           Assessment & Plan:   Subjective:     Outpatient Medications Prior to Visit  Medication Sig Dispense Refill   albuterol  (VENTOLIN  HFA) 108 (90 Base) MCG/ACT inhaler Inhale 2 puffs into the lungs every 6 (six) hours as needed for wheezing or shortness of breath. 1 each 0   butalbital -acetaminophen-caffeine  (FIORICET) 50-325-40 MG tablet Take 1 tablet by mouth 2 (two) times daily as needed for headache or migraine.     diclofenac  Sodium (VOLTAREN ) 1 % GEL Apply 4 g topically 4 (four) times daily as needed (for pain). 50 g 2   pantoprazole  (PROTONIX ) 20 MG tablet Take 1 tablet (20 mg total) by mouth daily. 45 tablet 5   venlafaxine  XR (EFFEXOR -XR) 37.5 MG 24 hr capsule TAKE 1 CAPSULE(37.5 MG) BY MOUTH DAILY WITH BREAKFAST 90 capsule 0   No facility-administered medications prior to visit.   Past Medical History:  Diagnosis Date   Abscess    Asthma    Depression    Encounter for management and injection of injectable progestin contraceptive 06/01/2014   Endometriosis    GERD (gastroesophageal reflux disease)    Panic attacks    Routine screening for STI (sexually transmitted infection) 06/01/2014   Past Surgical History:  Procedure Laterality Date   Leg mass removed     MYRINGOTOMY WITH TUBE PLACEMENT     TURBINATE REDUCTION Bilateral 08/30/2017   No Known Allergies    Objective:    Physical Exam Vitals and nursing note reviewed.  Constitutional:      Appearance: Normal appearance.  Cardiovascular:     Rate and Rhythm: Normal rate and regular rhythm.  Pulmonary:     Effort: Pulmonary effort is normal.     Breath sounds: Normal breath sounds.  Musculoskeletal:        General: Normal range of motion.  Skin:    General: Skin is warm and dry.  Neurological:     Mental Status: She is alert.  Psychiatric:        Mood and Affect: Mood normal.         Behavior: Behavior normal.    LMP 01/22/2024  Wt Readings from Last 3 Encounters:  02/11/24 157 lb 6.5 oz (71.4 kg)  12/03/23 157 lb 6.4 oz (71.4 kg)  06/14/23 146 lb (66.2 kg)       Versa Gore, NP

## 2024-02-14 NOTE — Telephone Encounter (Signed)
 Patient called in and stated she has an OV today @ 3:30pm to have ED f/u and discuss FMLA paperwork. Patient explained she would have to be self pay today since insurance isn't effective until 03/01/24. Patient informed [employer] agent that she can't afford out of pocket cost but needs her FMLA ppw completed. She wants to know if an OV is needed to get this done. Please advise.

## 2024-02-14 NOTE — Telephone Encounter (Signed)
 I returned pt's call. I Advised pt for new FMLA to be completed she will need an OV, Pt verbalized understanding. Also advised pt to have forms faxed over to our office or bring into office, if these are not received prior to appointment, she will need to reschedule until it is received.

## 2024-02-20 ENCOUNTER — Ambulatory Visit: Payer: Self-pay | Admitting: Family

## 2024-03-04 ENCOUNTER — Ambulatory Visit: Payer: Self-pay | Admitting: Family

## 2024-03-04 ENCOUNTER — Ambulatory Visit: Admitting: Family

## 2024-03-15 ENCOUNTER — Other Ambulatory Visit: Payer: Self-pay | Admitting: Family

## 2024-03-15 DIAGNOSIS — F411 Generalized anxiety disorder: Secondary | ICD-10-CM

## 2024-03-16 ENCOUNTER — Other Ambulatory Visit: Payer: Self-pay | Admitting: Family

## 2024-03-16 DIAGNOSIS — F411 Generalized anxiety disorder: Secondary | ICD-10-CM

## 2024-03-16 MED ORDER — VENLAFAXINE HCL ER 37.5 MG PO CP24: 37.5000 mg | ORAL_CAPSULE | Freq: Every day | ORAL | 1 refills | Status: DC

## 2024-03-16 NOTE — Telephone Encounter (Signed)
 Copied from CRM [id number]. Topic: Clinical - Medication Refill >> Mar 16, 2024 10:00 AM Turkey A wrote: Medication: venlafaxine  XR (EFFEXOR -XR) 37.5 MG 24 hr capsulevenlafaxine   Has the patient contacted their pharmacy? Yes (Agent: If no, request that the patient contact the pharmacy for the refill. If patient does not wish to contact the pharmacy document the reason why and proceed with request.) (Agent: If yes, when and what did the pharmacy advise?)  This is the patient's preferred pharmacy:  [id number] - [place], [place] - [address] AT [facility] [address] [facility] [address] [place] [address] Phone: [phone] Fax: [phone]  Is this the correct pharmacy for this prescription? Yes If no, delete pharmacy and type the correct one.   Has the prescription been filled recently? No  Is the patient out of the medication? Yes  Has the patient been seen for an appointment in the last year OR does the patient have an upcoming appointment? Yes  Can we respond through MyChart? Yes  Agent: Please be advised that Rx refills may take up to 3 business days. We ask that you follow-up with your pharmacy.

## 2024-05-19 ENCOUNTER — Other Ambulatory Visit: Payer: Self-pay

## 2024-05-19 ENCOUNTER — Telehealth: Payer: Self-pay

## 2024-05-19 DIAGNOSIS — F411 Generalized anxiety disorder: Secondary | ICD-10-CM

## 2024-05-19 MED ORDER — VENLAFAXINE HCL ER 37.5 MG PO CP24: 37.5000 mg | ORAL_CAPSULE | Freq: Two times a day (BID) | ORAL | 0 refills | Status: DC

## 2024-05-19 NOTE — Telephone Encounter (Signed)
 Copied from CRM #[id number]. Topic: Clinical - Medication Question >> May 18, 2024  3:52 PM Thersia BROCKS wrote: Reason for CRM: Patient called about venlafaxine  XR (EFFEXOR -XR) 37.5 MG 24 hr capsule needs the dosage upped on the 30 day supply of medication  Rx sent.

## 2024-06-09 ENCOUNTER — Ambulatory Visit
Admission: EM | Admit: 2024-06-09 | Discharge: 2024-06-09 | Disposition: A | Discharge status: Home / Self Care | Attending: Emergency Medicine | Admitting: Emergency Medicine

## 2024-06-09 ENCOUNTER — Encounter: Payer: Self-pay | Admitting: Emergency Medicine

## 2024-06-09 DIAGNOSIS — N898 Other specified noninflammatory disorders of vagina: Secondary | ICD-10-CM | POA: Insufficient documentation

## 2024-06-09 LAB — POCT URINE PREGNANCY: Preg Test, Ur: NEGATIVE

## 2024-06-09 MED ORDER — FLUCONAZOLE 150 MG PO TABS: 150.0000 mg | ORAL_TABLET | ORAL | 0 refills | Status: AC | PRN

## 2024-06-09 NOTE — Discharge Instructions (Signed)
 Today you are being treated  for yeast.   Take diflucan  150 mg once, if symptoms still present in 3 days then you may take second pill   Yeast infections which are caused by a naturally occurring fungus called candida. Vaginosis is an inflammation of the vagina that can result in discharge, itching and pain. The cause is usually a change in the normal balance of vaginal bacteria or an infection. Vaginosis can also result from reduced estrogen levels after menopause.  Labs pending 2-3 days, you will be contacted if positive for any sti and treatment will be sent to the pharmacy  Please refrain from having sex until labs results and symptoms have resolved  In addition:   Avoid baths, hot tubs and whirlpool spas.  Don't use scented or harsh soaps, such as those with deodorant or antibacterial action. Avoid irritants. These include scented tampons and pads. Wipe from front to back after using the toilet.  Don't douche. Your vagina doesn't require cleansing other than normal bathing.  Use a  condom. Wear cotton underwear, this fabric helps absorb moisture

## 2024-06-09 NOTE — ED Triage Notes (Signed)
 Patient reports vaginal itching x 1 week.  Patient also reports  burning sensation when she urinated today. Patient has been using clotrimazole 7 day with no improvement.

## 2024-06-09 NOTE — ED Provider Notes (Signed)
 Angela Ramos    CSN: [id number] Arrival date & time: 06/09/24  1545      History   Chief Complaint Chief Complaint  Patient presents with   Vaginal Itching   Dysuria    HPI Angela Ramos is a 28 y.o. female.   Patient presents for evaluation of vaginal itching beginning 7 days ago, had 1 occurrence of dysuria today while in clinic.  Has been attempting over-the-counter antifungal medicine without relief.  Sexually active, denies known exposure.  Denies abdominal, flank pain, vaginal discharge or odor, urinary symptoms.  Past Medical History:  Diagnosis Date   Abscess    Asthma    Depression    Encounter for management and injection of injectable progestin contraceptive 06/01/2014   Endometriosis    GERD (gastroesophageal reflux disease)    Panic attacks    Routine screening for STI (sexually transmitted infection) 06/01/2014    Patient Active Problem List   Diagnosis Date Noted   Acute nonintractable headache 08/09/2023   Generalized anxiety disorder 09/25/2022   Left hip pain 09/25/2022   Mild intermittent asthma without complication 09/25/2022   Endometriosis 05/18/2022   Dysmenorrhea 06/01/2014    Past Surgical History:  Procedure Laterality Date   Leg mass removed     MYRINGOTOMY WITH TUBE PLACEMENT     TURBINATE REDUCTION Bilateral 08/30/2017    OB History   No obstetric history on file.      Home Medications    Prior to Admission medications   Medication Sig Start Date End Date Taking? Authorizing Provider  fluconazole  (DIFLUCAN ) 150 MG tablet Take 1 tablet (150 mg total) by mouth every three (3) days as needed for up to 2 doses. 06/09/24  Yes Barbi Kumagai, Shelba SAUNDERS, NP  albuterol  (VENTOLIN  HFA) 108 (90 Base) MCG/ACT inhaler Inhale 2 puffs into the lungs every 6 (six) hours as needed for wheezing or shortness of breath. 02/06/23 09/03/30  Lucius Krabbe, NP  butalbital -acetaminophen-caffeine  (FIORICET) 50-325-40 MG tablet Take 1 tablet  by mouth 2 (two) times daily as needed for headache or migraine.    [provider]  diclofenac  Sodium (VOLTAREN ) 1 % GEL Apply 4 g topically 4 (four) times daily as needed (for pain). 09/25/22   Lucius Krabbe, NP  pantoprazole  (PROTONIX ) 20 MG tablet Take 1 tablet (20 mg total) by mouth daily. 01/10/24   Lucius Krabbe, NP  venlafaxine  XR (EFFEXOR -XR) 37.5 MG 24 hr capsule Take 1 capsule (37.5 mg total) by mouth 2 (two) times daily with a meal. 05/19/24   Lucius Krabbe, NP    Family History Family History  Problem Relation Age of Onset   Diabetes Maternal Aunt    Hypertension Maternal Aunt    Hypertension Maternal Grandmother    Arthritis Maternal Grandmother     Social History Social History   Tobacco Use   Smoking status: Never   Smokeless tobacco: Never  Vaping Use   Vaping status: Never Used  Substance Use Topics   Alcohol use: No   Drug use: No     Allergies   Patient has no known allergies.   Review of Systems Review of Systems   Physical Exam Triage Vital Signs ED Triage Vitals  Encounter Vitals Group     BP 06/09/24 1642 108/76     Girls Systolic BP Percentile --      Girls Diastolic BP Percentile --      Boys Systolic BP Percentile --      Boys Diastolic BP Percentile --  Pulse Rate 06/09/24 1642 (!) 103     Resp 06/09/24 1642 18     Temp 06/09/24 1642 98.5 F (36.9 C)     Temp Source 06/09/24 1642 Oral     SpO2 06/09/24 1642 100 %     Weight --      Height --      Head Circumference --      Peak Flow --      Pain Score 06/09/24 1638 0     Pain Loc --      Pain Education --      Exclude from Growth Chart --    No data found.  Updated Vital Signs BP 108/76 (BP Location: Left Arm)   Pulse (!) 103   Temp 98.5 F (36.9 C) (Oral)   Resp 18   LMP 05/13/2024 (Exact Date)   SpO2 100%   Visual Acuity Right Eye Distance:   Left Eye Distance:   Bilateral Distance:    Right Eye Near:   Left Eye Near:    Bilateral  Near:     Physical Exam Constitutional:      Appearance: Normal appearance.  Eyes:     Extraocular Movements: Extraocular movements intact.  Pulmonary:     Effort: Pulmonary effort is normal.  Abdominal:     Tenderness: There is no abdominal tenderness. There is no right CVA tenderness, left CVA tenderness or guarding.  Neurological:     Mental Status: She is alert and oriented to person, place, and time.      UC Treatments / Results  Labs (all labs ordered are listed, but only abnormal results are displayed) Labs Reviewed  POCT URINE PREGNANCY - Normal  CERVICOVAGINAL ANCILLARY ONLY    EKG   Radiology No results found.  Procedures Procedures (including critical care time)  Medications Ordered in UC Medications - No data to display  Initial Impression / Assessment and Plan / UC Course  I have reviewed the triage vital signs and the nursing notes.  Pertinent labs & imaging results that were available during my care of the patient were reviewed by me and considered in my medical decision making (see chart for details).  Vaginal itching  Treating empirically for yeast, Diflucan  prescribed, discussed administration, urine pregnancy negative, STI labs checking for yeast and bacterial vaginosis pending, will treat additionally per protocol, advised abstinence until lab results and all treatment is complete, recommended over-the-counter medications and nonpharmacological measures and advised follow-up if symptoms continue to persist Final Clinical Impressions(s) / UC Diagnoses   Final diagnoses:  Vaginal itching     Discharge Instructions      Today you are being treated  for yeast.   Take diflucan  150 mg once, if symptoms still present in 3 days then you may take second pill   Yeast infections which are caused by a naturally occurring fungus called candida. Vaginosis is an inflammation of the vagina that can result in discharge, itching and pain. The cause is  usually a change in the normal balance of vaginal bacteria or an infection. Vaginosis can also result from reduced estrogen levels after menopause.  Labs pending 2-3 days, you will be contacted if positive for any sti and treatment will be sent to the pharmacy  Please refrain from having sex until labs results and symptoms have resolved  In addition:   Avoid baths, hot tubs and whirlpool spas.  Don't use scented or harsh soaps, such as those with deodorant or antibacterial action. Avoid irritants.  These include scented tampons and pads. Wipe from front to back after using the toilet.  Don't douche. Your vagina doesn't require cleansing other than normal bathing.  Use a  condom. Wear cotton underwear, this fabric helps absorb moisture     ED Prescriptions     Medication Sig Dispense Auth. Provider   fluconazole  (DIFLUCAN ) 150 MG tablet Take 1 tablet (150 mg total) by mouth every three (3) days as needed for up to 2 doses. 2 tablet Estefani Bateson R, NP      PDMP not reviewed this encounter.   Teresa Shelba SAUNDERS, NP 06/09/24 1810

## 2024-06-10 LAB — CERVICOVAGINAL ANCILLARY ONLY
Bacterial Vaginitis (gardnerella): NEGATIVE
Candida Glabrata: POSITIVE — AB
Candida Vaginitis: POSITIVE — AB
Comment: NEGATIVE
Comment: NEGATIVE
Comment: NEGATIVE

## 2024-06-11 ENCOUNTER — Ambulatory Visit (HOSPITAL_COMMUNITY): Payer: Self-pay

## 2024-08-09 ENCOUNTER — Other Ambulatory Visit: Payer: Self-pay | Admitting: Family

## 2024-08-09 DIAGNOSIS — F411 Generalized anxiety disorder: Secondary | ICD-10-CM

## 2024-09-29 ENCOUNTER — Other Ambulatory Visit: Payer: Self-pay | Admitting: Family

## 2024-09-29 DIAGNOSIS — F411 Generalized anxiety disorder: Secondary | ICD-10-CM

## 2024-09-29 NOTE — Telephone Encounter (Signed)
 Angela Ramos, is pt suppose to be taking 1 capsule or 2 capsules a day. Please advise.

## 2024-09-29 NOTE — Telephone Encounter (Unsigned)
 Copied from CRM [id number]. Topic: Clinical - Medication Refill >> Sep 29, 2024  3:37 PM Jasmin G wrote: Medication: venlafaxine  XR (EFFEXOR -XR) 37.5 MG 24 hr capsule  Has the patient contacted their pharmacy? Yes (Agent: If no, request that the patient contact the pharmacy for the refill. If patient does not wish to contact the pharmacy document the reason why and proceed with request.) (Agent: If yes, when and what did the pharmacy advise?)  This is the patient's preferred pharmacy:  [id number] - [place], [place] - [address] AT [facility] [address] [facility] [address] [place] [address] Phone: [phone] Fax: [phone]  Is this the correct pharmacy for this prescription? Yes If no, delete pharmacy and type the correct one.   Has the prescription been filled recently? Yes  Is the patient out of the medication? Yes  Has the patient been seen for an appointment in the last year OR does the patient have an upcoming appointment? Yes  Can we respond through MyChart? No  Agent: Please be advised that Rx refills may take up to 3 business days. We ask that you follow-up with your pharmacy.

## 2024-09-30 MED ORDER — VENLAFAXINE HCL ER 37.5 MG PO CP24: 37.5000 mg | ORAL_CAPSULE | Freq: Every day | ORAL | 0 refills | Status: AC

## 2024-09-30 NOTE — Telephone Encounter (Signed)
 needs OV
